# Patient Record
Sex: Female | Born: 1980 | Race: Black or African American | Hispanic: No | Marital: Single | Smoking: Never smoker
Health system: Southern US, Community
[De-identification: ages and names within clinical notes are randomized; demographics above are authoritative.]

## PROBLEM LIST (undated history)

## (undated) DIAGNOSIS — E119 Type 2 diabetes mellitus without complications: Secondary | ICD-10-CM

## (undated) DIAGNOSIS — Z789 Other specified health status: Secondary | ICD-10-CM

## (undated) HISTORY — DX: Other specified health status: Z78.9

## (undated) HISTORY — PX: NO PAST SURGERIES: SHX2092

---

## 2021-01-09 LAB — OB RESULTS CONSOLE RPR: RPR: NONREACTIVE

## 2021-01-09 LAB — OB RESULTS CONSOLE GC/CHLAMYDIA
Chlamydia: NEGATIVE
Gonorrhea: NEGATIVE

## 2021-01-09 LAB — OB RESULTS CONSOLE HIV ANTIBODY (ROUTINE TESTING): HIV: NONREACTIVE

## 2021-01-09 LAB — OB RESULTS CONSOLE ANTIBODY SCREEN: Antibody Screen: NEGATIVE

## 2021-01-09 LAB — OB RESULTS CONSOLE ABO/RH: RH Type: POSITIVE

## 2021-01-09 LAB — HEPATITIS C ANTIBODY: HCV Ab: NEGATIVE

## 2021-01-09 LAB — OB RESULTS CONSOLE VARICELLA ZOSTER ANTIBODY, IGG: Varicella: IMMUNE

## 2021-01-09 LAB — OB RESULTS CONSOLE RUBELLA ANTIBODY, IGM: Rubella: IMMUNE

## 2021-01-09 LAB — OB RESULTS CONSOLE HGB/HCT, BLOOD
HCT: 33 (ref 29–41)
Hemoglobin: 10.6

## 2021-01-09 LAB — OB RESULTS CONSOLE HEPATITIS B SURFACE ANTIGEN: Hepatitis B Surface Ag: NEGATIVE

## 2021-01-09 LAB — OB RESULTS CONSOLE PLATELET COUNT: Platelets: 247

## 2021-01-16 LAB — HEMOGLOBIN EVAL RFX ELECTROPHORESIS
Cystic Fibrosis Profile: NEGATIVE
Drug Screen, Urine: NEGATIVE
Glucose 1 Hour: 90
Hemoglobin Evaluation: NORMAL
Pap: NEGATIVE
Urine Culture, OB: NORMAL

## 2021-01-20 ENCOUNTER — Inpatient Hospital Stay (HOSPITAL_COMMUNITY)
Admission: AD | Admit: 2021-01-20 | Payer: Medicaid Other | Source: Home / Self Care | Admitting: Obstetrics & Gynecology

## 2021-01-28 ENCOUNTER — Other Ambulatory Visit: Payer: Self-pay | Admitting: Family Medicine

## 2021-01-28 DIAGNOSIS — Z363 Encounter for antenatal screening for malformations: Secondary | ICD-10-CM

## 2021-01-28 DIAGNOSIS — O09522 Supervision of elderly multigravida, second trimester: Secondary | ICD-10-CM

## 2021-02-03 ENCOUNTER — Other Ambulatory Visit: Payer: Self-pay

## 2021-02-11 ENCOUNTER — Encounter: Payer: Self-pay | Admitting: *Deleted

## 2021-02-12 ENCOUNTER — Ambulatory Visit: Payer: Self-pay

## 2021-02-14 ENCOUNTER — Ambulatory Visit: Payer: Medicaid Other | Admitting: *Deleted

## 2021-02-14 ENCOUNTER — Ambulatory Visit: Payer: Medicaid Other

## 2021-02-14 ENCOUNTER — Other Ambulatory Visit: Payer: Self-pay

## 2021-02-14 ENCOUNTER — Encounter: Payer: Self-pay | Admitting: *Deleted

## 2021-02-14 ENCOUNTER — Ambulatory Visit: Payer: Self-pay | Attending: Obstetrics | Admitting: Obstetrics

## 2021-02-14 ENCOUNTER — Ambulatory Visit: Payer: Medicaid Other | Attending: Family Medicine

## 2021-02-14 ENCOUNTER — Other Ambulatory Visit: Payer: Self-pay | Admitting: *Deleted

## 2021-02-14 VITALS — BP 105/56 | HR 88 | Ht 63.0 in

## 2021-02-14 DIAGNOSIS — O3513X0 Maternal care for (suspected) chromosomal abnormality in fetus, trisomy 21, not applicable or unspecified: Secondary | ICD-10-CM

## 2021-02-14 DIAGNOSIS — Z363 Encounter for antenatal screening for malformations: Secondary | ICD-10-CM | POA: Diagnosis present

## 2021-02-14 DIAGNOSIS — O09522 Supervision of elderly multigravida, second trimester: Secondary | ICD-10-CM

## 2021-02-14 DIAGNOSIS — O99212 Obesity complicating pregnancy, second trimester: Secondary | ICD-10-CM

## 2021-02-14 DIAGNOSIS — O280 Abnormal hematological finding on antenatal screening of mother: Secondary | ICD-10-CM

## 2021-02-14 DIAGNOSIS — Z3A23 23 weeks gestation of pregnancy: Secondary | ICD-10-CM

## 2021-02-14 NOTE — Progress Notes (Signed)
MFM Note  Referring provider: [facility] Department  Joyce Campbell was seen for a detailed fetal anatomy scan due to advanced maternal age (40 years old).  Her quad screen indicated an elevated Down syndrome risk of 1:51.  She denies any significant past medical history and denies any problems in her current pregnancy.    She was informed that the fetal growth and amniotic fluid level were appropriate for her gestational age.   The following were noted on today's ultrasound exam:  Mild bilateral ventriculomegaly in the fetal brain measuring 1.2 to 1.3 cm dilated.  The posterior fossa appeared within normal limits.  The third and fourth ventricles did not appear dilated.  The association of the ventriculomegaly with Down syndrome was discussed today.  Absent fetal nasal bone.  The association of an absent fetal nasal bone with Down syndrome was discussed today.  The patient was informed that anomalies may be missed due to technical limitations.   The patient was advised that the ventriculomegaly and the absent nasal bone noted on today's ultrasound exam further increases her Down syndrome risk.  She was advised that she may have a cell free DNA test drawn today to further refine her Down syndrome risk.  She declined to have the cell free DNA test drawn today.    She was also advised regarding the availability of an amniocentesis for definitive diagnosis of fetal chromosomal abnormalities.  She declined to have an amniocentesis today.  The patient stated that she will just wait until after the baby is born and have the baby tested at that time.  She declined genetic counseling today.  Due to her increased Down syndrome risk, we will continue to follow her with monthly growth ultrasounds.    She was referred for a fetal echocardiogram with [facility].  We will consider ordering a fetal MRI should the ventriculomegaly worsen later in her pregnancy.  The  patient stated that she understood everything that was discussed with her and that all of her questions have been answered today.    All conversations were held with the patient today with the help of a [place] interpreter.  A total of 45 minutes was spent counseling and coordinating the care for this patient.  Greater than 50% of the time was spent in direct face-to-face contact.

## 2021-02-14 NOTE — Progress Notes (Signed)
Joyce Campbell [facility] by Dr. Parke Poisson on 02/14/2021 for an anatomy ultrasound in light of her abnormal quad screen indicating increased risk for Down syndrome. [name] was offered genetic counseling today to discuss the abnormal quad screen result. [name] declined genetic counseling.  Teena Dunk, MS, [facility]

## 2021-03-17 ENCOUNTER — Ambulatory Visit: Payer: Medicaid Other | Admitting: *Deleted

## 2021-03-17 ENCOUNTER — Encounter: Payer: Self-pay | Admitting: *Deleted

## 2021-03-17 ENCOUNTER — Ambulatory Visit: Payer: Medicaid Other | Attending: Obstetrics

## 2021-03-17 ENCOUNTER — Other Ambulatory Visit: Payer: Self-pay

## 2021-03-17 VITALS — BP 114/64 | HR 96

## 2021-03-17 DIAGNOSIS — Z3A28 28 weeks gestation of pregnancy: Secondary | ICD-10-CM

## 2021-03-17 DIAGNOSIS — O358XX0 Maternal care for other (suspected) fetal abnormality and damage, not applicable or unspecified: Secondary | ICD-10-CM

## 2021-03-17 DIAGNOSIS — O3513X0 Maternal care for (suspected) chromosomal abnormality in fetus, trisomy 21, not applicable or unspecified: Secondary | ICD-10-CM | POA: Diagnosis present

## 2021-03-17 DIAGNOSIS — O285 Abnormal chromosomal and genetic finding on antenatal screening of mother: Secondary | ICD-10-CM | POA: Insufficient documentation

## 2021-03-17 DIAGNOSIS — O09523 Supervision of elderly multigravida, third trimester: Secondary | ICD-10-CM | POA: Insufficient documentation

## 2021-03-17 DIAGNOSIS — O09522 Supervision of elderly multigravida, second trimester: Secondary | ICD-10-CM | POA: Diagnosis present

## 2021-03-17 DIAGNOSIS — Z658 Other specified problems related to psychosocial circumstances: Secondary | ICD-10-CM

## 2021-03-17 DIAGNOSIS — O0990 Supervision of high risk pregnancy, unspecified, unspecified trimester: Secondary | ICD-10-CM | POA: Insufficient documentation

## 2021-03-17 DIAGNOSIS — O0930 Supervision of pregnancy with insufficient antenatal care, unspecified trimester: Secondary | ICD-10-CM | POA: Insufficient documentation

## 2021-03-17 HISTORY — DX: Abnormal chromosomal and genetic finding on antenatal screening of mother: O28.5

## 2021-03-21 ENCOUNTER — Encounter: Payer: Self-pay | Admitting: Obstetrics & Gynecology

## 2021-03-21 ENCOUNTER — Other Ambulatory Visit: Payer: Self-pay

## 2021-03-21 ENCOUNTER — Ambulatory Visit (INDEPENDENT_AMBULATORY_CARE_PROVIDER_SITE_OTHER): Payer: Medicaid Other | Admitting: Obstetrics & Gynecology

## 2021-03-21 VITALS — BP 121/73 | HR 103 | Wt 215.0 lb

## 2021-03-21 DIAGNOSIS — Z658 Other specified problems related to psychosocial circumstances: Secondary | ICD-10-CM

## 2021-03-21 DIAGNOSIS — O0930 Supervision of pregnancy with insufficient antenatal care, unspecified trimester: Secondary | ICD-10-CM | POA: Diagnosis not present

## 2021-03-21 DIAGNOSIS — Z23 Encounter for immunization: Secondary | ICD-10-CM | POA: Diagnosis not present

## 2021-03-21 DIAGNOSIS — O35BXX0 Maternal care for other (suspected) fetal abnormality and damage, fetal cardiac anomalies, not applicable or unspecified: Secondary | ICD-10-CM | POA: Insufficient documentation

## 2021-03-21 DIAGNOSIS — Q210 Ventricular septal defect: Secondary | ICD-10-CM | POA: Diagnosis not present

## 2021-03-21 DIAGNOSIS — O285 Abnormal chromosomal and genetic finding on antenatal screening of mother: Secondary | ICD-10-CM | POA: Diagnosis not present

## 2021-03-21 DIAGNOSIS — O0990 Supervision of high risk pregnancy, unspecified, unspecified trimester: Secondary | ICD-10-CM

## 2021-03-21 DIAGNOSIS — O09523 Supervision of elderly multigravida, third trimester: Secondary | ICD-10-CM | POA: Insufficient documentation

## 2021-03-21 HISTORY — DX: Maternal care for other (suspected) fetal abnormality and damage, fetal cardiac anomalies, not applicable or unspecified: O35.BXX0

## 2021-03-21 MED ORDER — PRENATAL PLUS 27-1 MG PO TABS: 1.0000 | ORAL_TABLET | Freq: Every day | ORAL | 11 refills | Status: AC

## 2021-03-21 NOTE — Progress Notes (Signed)
  Subjective:transfer due to DSR    Joyce Campbell is a [id number] [redacted]w[redacted]d being seen today for her first obstetrical visit.  Her obstetrical history is significant for  AMA and DSR elevated . Patient does intend to breast feed. Pregnancy history fully reviewed.  Patient reports nausea.  Vitals:   03/21/21 1007  BP: 121/73  Pulse: (!) 103  Weight: 215 lb (97.5 kg)    HISTORY: OB History  Gravida Para Term Preterm AB Living  4 3 3     3   SAB IAB Ectopic Multiple Live Births          3    # Outcome Date GA Lbr Len/2nd Weight Sex Delivery Anes PTL Lv  4 Current           3 Term 12/10/14 [redacted]w[redacted]d  8 lb (3.629 kg)  Vag-Spont     2 Term 08/31/09 [redacted]w[redacted]d  9 lb (4.082 kg)  Vag-Spont     1 Term 02/20/07 [redacted]w[redacted]d  8 lb (3.629 kg)  Vag-Spont      Past Medical History:  Diagnosis Date   Medical history non-contributory    Past Surgical History:  Procedure Laterality Date   NO PAST SURGERIES     Family History  Problem Relation Age of Onset   Hypertension Mother      Exam    Uterus:   46 CM FH  Pelvic Exam:    Perineum: Pelvic deferred   Vulva:    Vagina:     pH:    Cervix:    Adnexa:    Bony Pelvis:   System: Breast:  deferred   Skin: normal coloration and turgor, no rashes    Neurologic: normal, normal mood   Extremities: normal strength, tone, and muscle mass   HEENT PERRLA   Mouth/Teeth    Neck supple   Cardiovascular: regular rate and rhythm   Respiratory:  appears well, vitals normal, no respiratory distress, acyanotic, normal RR   Abdomen: Gravid and obese   Urinary:       Assessment:    Pregnancy: 34 Patient Active Problem List   Diagnosis Date Noted   Abnormal genetic test during pregnancy 03/17/2021   Late prenatal care 03/17/2021   Supervision of high risk pregnancy, antepartum 03/17/2021   Psychosocial stressors 03/17/2021    Fetal VSD DSR  elevated declined chromosomal testing   Plan:     Initial labs abstracted Prenatal vitamins and  ASA 81 mg Problem list reviewed and updated. Genetic Screening discussed : results reviewed.  Ultrasound discussed; fetal survey: results reviewed.  Follow up in 2 weeks. 50% of 30 min visit spent on counseling and coordination of care.  MFM f/u.   03/19/2021 03/21/2021

## 2021-04-07 ENCOUNTER — Ambulatory Visit (INDEPENDENT_AMBULATORY_CARE_PROVIDER_SITE_OTHER): Payer: Medicaid Other | Admitting: Obstetrics and Gynecology

## 2021-04-07 ENCOUNTER — Other Ambulatory Visit: Payer: Self-pay

## 2021-04-07 ENCOUNTER — Encounter: Payer: Self-pay | Admitting: Obstetrics and Gynecology

## 2021-04-07 VITALS — BP 114/66 | HR 101 | Wt 220.3 lb

## 2021-04-07 DIAGNOSIS — O09523 Supervision of elderly multigravida, third trimester: Secondary | ICD-10-CM | POA: Diagnosis not present

## 2021-04-07 DIAGNOSIS — O0990 Supervision of high risk pregnancy, unspecified, unspecified trimester: Secondary | ICD-10-CM | POA: Diagnosis not present

## 2021-04-07 DIAGNOSIS — O285 Abnormal chromosomal and genetic finding on antenatal screening of mother: Secondary | ICD-10-CM

## 2021-04-07 DIAGNOSIS — Z789 Other specified health status: Secondary | ICD-10-CM

## 2021-04-07 DIAGNOSIS — Q210 Ventricular septal defect: Secondary | ICD-10-CM

## 2021-04-07 MED ORDER — COMFORT FIT MATERNITY SUPP MED MISC: 1.0000 | Freq: Once | 0 refills | Status: AC

## 2021-04-07 MED ORDER — ASPIRIN 81 MG PO TBEC: 81.0000 mg | DELAYED_RELEASE_TABLET | Freq: Every day | ORAL | 2 refills | Status: DC

## 2021-04-07 NOTE — Progress Notes (Signed)
° °  PRENATAL VISIT NOTE  Subjective:  Joyce Campbell is a 40 y.o. G4P3003 at 109w0d being seen today for ongoing prenatal care.  She is currently monitored for the following issues for this high-risk pregnancy and has Fetus with chance for Trisomy 38; Late prenatal care; Supervision of high risk pregnancy, antepartum; Psychosocial stressors; AMA (advanced maternal age) multigravida 35+, third trimester; VSD (ventricular septal defect), perimembranous; and Language barrier on their problem list.  Patient reports  occasional pelvic pressure .  Contractions: Not present. Vag. Bleeding: None.  Movement: Present. Denies leaking of fluid.   The following portions of the patient's history were reviewed and updated as appropriate: allergies, current medications, past family history, past medical history, past social history, past surgical history and problem list.   Objective:   Vitals:   04/07/21 1316  BP: 114/66  Pulse: (!) 101  Weight: 220 lb 4.8 oz (99.9 kg)    Fetal Status: Fetal Heart Rate (bpm): 142   Movement: Present     General:  Alert, oriented and cooperative. Patient is in no acute distress.  Skin: Skin is warm and dry. No rash noted.   Cardiovascular: Normal heart rate noted  Respiratory: Normal respiratory effort, no problems with respiration noted  Abdomen: Soft, gravid, appropriate for gestational age.  Pain/Pressure: Absent     Pelvic: Cervical exam deferred        Extremities: Normal range of motion.  Edema: None  Mental Status: Normal mood and affect. Normal behavior. Normal judgment and thought content.   Assessment and Plan:  Pregnancy: G4P3003 at [redacted]w[redacted]d 1. Supervision of high risk pregnancy, antepartum Pregnancy belt sent in. Asa refill sent in Pt had an early 1h GTT which was negative but has not had a 24-28wk one. Pt unable to do today. Will do next time she is here along with hiv, rpr, cbc - aspirin 81 MG EC tablet; Take 1 tablet (81 mg total) by mouth daily.  Swallow whole.  Dispense: 30 tablet; Refill: 2  2. Language barrier Interpreter used  3. AMA (advanced maternal age) multigravida 35+, third trimester See below  4. VSD (ventricular septal defect), perimembranous Has rpt fetal echo on 1/12. Should be fine for vag delivery and delivery at [facility] per last report but f/u 1/12 report  5. Fetus with chance for Trisomy 21 F/u 12/28 rpt growth. 11/28 87%, 1387gm, ac >99%, afi 14.3. fetus also with slight b/l ventriculomegaly (11-76mm) and absent nasal bone  Preterm labor symptoms and general obstetric precautions including but not limited to vaginal bleeding, contractions, leaking of fluid and fetal movement were reviewed in detail with the patient. Please refer to After Visit Summary for other counseling recommendations.   Return in 9 days (on 04/16/2021) for 1h GTT test.  Future Appointments  Date Time Provider Department Center  04/16/2021  9:15 AM [facility] NURSE [facility]  04/16/2021  9:30 AM [facility] US3 [facility]  04/23/2021  9:30 AM [facility] NURSE [facility]  04/23/2021  9:45 AM [facility] US6 [facility]    [place] Bing, MD

## 2021-04-09 ENCOUNTER — Other Ambulatory Visit: Payer: Self-pay | Admitting: *Deleted

## 2021-04-09 DIAGNOSIS — O09523 Supervision of elderly multigravida, third trimester: Secondary | ICD-10-CM

## 2021-04-09 DIAGNOSIS — O35BXX1 Maternal care for other (suspected) fetal abnormality and damage, fetal cardiac anomalies, fetus 1: Secondary | ICD-10-CM

## 2021-04-09 DIAGNOSIS — O280 Abnormal hematological finding on antenatal screening of mother: Secondary | ICD-10-CM

## 2021-04-15 ENCOUNTER — Other Ambulatory Visit: Payer: Self-pay | Admitting: *Deleted

## 2021-04-15 DIAGNOSIS — O35BXX0 Maternal care for other (suspected) fetal abnormality and damage, fetal cardiac anomalies, not applicable or unspecified: Secondary | ICD-10-CM

## 2021-04-15 DIAGNOSIS — O280 Abnormal hematological finding on antenatal screening of mother: Secondary | ICD-10-CM

## 2021-04-15 DIAGNOSIS — O09523 Supervision of elderly multigravida, third trimester: Secondary | ICD-10-CM

## 2021-04-16 ENCOUNTER — Ambulatory Visit: Payer: Medicaid Other | Admitting: *Deleted

## 2021-04-16 ENCOUNTER — Other Ambulatory Visit: Payer: Self-pay | Admitting: *Deleted

## 2021-04-16 ENCOUNTER — Other Ambulatory Visit: Payer: Self-pay

## 2021-04-16 ENCOUNTER — Other Ambulatory Visit: Payer: Self-pay | Admitting: General Practice

## 2021-04-16 ENCOUNTER — Other Ambulatory Visit: Payer: Self-pay | Admitting: Obstetrics

## 2021-04-16 ENCOUNTER — Ambulatory Visit: Payer: Medicaid Other | Attending: Obstetrics

## 2021-04-16 ENCOUNTER — Other Ambulatory Visit: Payer: Medicaid Other

## 2021-04-16 VITALS — BP 106/61 | HR 104

## 2021-04-16 DIAGNOSIS — O283 Abnormal ultrasonic finding on antenatal screening of mother: Secondary | ICD-10-CM

## 2021-04-16 DIAGNOSIS — O35BXX0 Maternal care for other (suspected) fetal abnormality and damage, fetal cardiac anomalies, not applicable or unspecified: Secondary | ICD-10-CM | POA: Diagnosis present

## 2021-04-16 DIAGNOSIS — O0990 Supervision of high risk pregnancy, unspecified, unspecified trimester: Secondary | ICD-10-CM

## 2021-04-16 DIAGNOSIS — O3663X1 Maternal care for excessive fetal growth, third trimester, fetus 1: Secondary | ICD-10-CM

## 2021-04-16 DIAGNOSIS — O285 Abnormal chromosomal and genetic finding on antenatal screening of mother: Secondary | ICD-10-CM

## 2021-04-16 DIAGNOSIS — O0930 Supervision of pregnancy with insufficient antenatal care, unspecified trimester: Secondary | ICD-10-CM | POA: Insufficient documentation

## 2021-04-16 DIAGNOSIS — O280 Abnormal hematological finding on antenatal screening of mother: Secondary | ICD-10-CM | POA: Diagnosis present

## 2021-04-16 DIAGNOSIS — O09523 Supervision of elderly multigravida, third trimester: Secondary | ICD-10-CM | POA: Diagnosis present

## 2021-04-16 DIAGNOSIS — Z658 Other specified problems related to psychosocial circumstances: Secondary | ICD-10-CM

## 2021-04-16 NOTE — Procedures (Signed)
Joyce Campbell [birth year] [redacted]w[redacted]d  Fetus A Non-Stress Test Interpretation for 04/16/21  Indication: Unsatisfactory BPP  Fetal Heart Rate A Mode: External Baseline Rate (A): 140 bpm Variability: Moderate Accelerations: 15 x 15 Decelerations: None Multiple birth?: No  Uterine Activity Mode: Palpation, Toco Contraction Frequency (min): none Resting Tone Palpated: Relaxed  Interpretation (Fetal Testing) Nonstress Test Interpretation: Reactive Overall Impression: Reassuring for gestational age Comments: Dr. Parke Poisson reviewed tracing

## 2021-04-23 ENCOUNTER — Ambulatory Visit: Payer: Medicaid Other | Admitting: *Deleted

## 2021-04-23 ENCOUNTER — Other Ambulatory Visit: Payer: Self-pay | Admitting: *Deleted

## 2021-04-23 ENCOUNTER — Encounter: Payer: Self-pay | Admitting: *Deleted

## 2021-04-23 ENCOUNTER — Ambulatory Visit: Payer: Medicaid Other | Attending: Obstetrics

## 2021-04-23 ENCOUNTER — Other Ambulatory Visit: Payer: Self-pay

## 2021-04-23 VITALS — BP 98/50 | HR 99

## 2021-04-23 DIAGNOSIS — Z658 Other specified problems related to psychosocial circumstances: Secondary | ICD-10-CM | POA: Diagnosis present

## 2021-04-23 DIAGNOSIS — O09523 Supervision of elderly multigravida, third trimester: Secondary | ICD-10-CM | POA: Diagnosis present

## 2021-04-23 DIAGNOSIS — O0990 Supervision of high risk pregnancy, unspecified, unspecified trimester: Secondary | ICD-10-CM | POA: Insufficient documentation

## 2021-04-23 DIAGNOSIS — O280 Abnormal hematological finding on antenatal screening of mother: Secondary | ICD-10-CM | POA: Diagnosis present

## 2021-04-23 DIAGNOSIS — O35BXX0 Maternal care for other (suspected) fetal abnormality and damage, fetal cardiac anomalies, not applicable or unspecified: Secondary | ICD-10-CM | POA: Diagnosis present

## 2021-04-23 DIAGNOSIS — O285 Abnormal chromosomal and genetic finding on antenatal screening of mother: Secondary | ICD-10-CM | POA: Diagnosis present

## 2021-04-23 DIAGNOSIS — O0930 Supervision of pregnancy with insufficient antenatal care, unspecified trimester: Secondary | ICD-10-CM

## 2021-04-23 DIAGNOSIS — O3509X0 Maternal care for (suspected) other central nervous system malformation or damage in fetus, not applicable or unspecified: Secondary | ICD-10-CM

## 2021-04-23 DIAGNOSIS — Q758 Other specified congenital malformations of skull and face bones: Secondary | ICD-10-CM

## 2021-04-23 DIAGNOSIS — O3513X0 Maternal care for (suspected) chromosomal abnormality in fetus, trisomy 21, not applicable or unspecified: Secondary | ICD-10-CM

## 2021-04-25 ENCOUNTER — Other Ambulatory Visit: Payer: Medicaid Other

## 2021-04-25 ENCOUNTER — Encounter: Payer: Self-pay | Admitting: Obstetrics and Gynecology

## 2021-04-25 ENCOUNTER — Ambulatory Visit (INDEPENDENT_AMBULATORY_CARE_PROVIDER_SITE_OTHER): Payer: Medicaid Other | Admitting: Obstetrics and Gynecology

## 2021-04-25 ENCOUNTER — Other Ambulatory Visit: Payer: Self-pay

## 2021-04-25 VITALS — BP 115/79 | HR 98 | Wt 220.0 lb

## 2021-04-25 DIAGNOSIS — Z789 Other specified health status: Secondary | ICD-10-CM

## 2021-04-25 DIAGNOSIS — O0990 Supervision of high risk pregnancy, unspecified, unspecified trimester: Secondary | ICD-10-CM

## 2021-04-25 DIAGNOSIS — O09523 Supervision of elderly multigravida, third trimester: Secondary | ICD-10-CM | POA: Diagnosis not present

## 2021-04-25 DIAGNOSIS — O3509X0 Maternal care for (suspected) other central nervous system malformation or damage in fetus, not applicable or unspecified: Secondary | ICD-10-CM

## 2021-04-25 DIAGNOSIS — O403XX0 Polyhydramnios, third trimester, not applicable or unspecified: Secondary | ICD-10-CM | POA: Insufficient documentation

## 2021-04-25 DIAGNOSIS — O3660X0 Maternal care for excessive fetal growth, unspecified trimester, not applicable or unspecified: Secondary | ICD-10-CM | POA: Insufficient documentation

## 2021-04-25 DIAGNOSIS — O3663X0 Maternal care for excessive fetal growth, third trimester, not applicable or unspecified: Secondary | ICD-10-CM

## 2021-04-25 DIAGNOSIS — O285 Abnormal chromosomal and genetic finding on antenatal screening of mother: Secondary | ICD-10-CM

## 2021-04-25 DIAGNOSIS — O0930 Supervision of pregnancy with insufficient antenatal care, unspecified trimester: Secondary | ICD-10-CM

## 2021-04-25 DIAGNOSIS — Q210 Ventricular septal defect: Secondary | ICD-10-CM

## 2021-04-25 DIAGNOSIS — O3513X0 Maternal care for (suspected) chromosomal abnormality in fetus, trisomy 21, not applicable or unspecified: Secondary | ICD-10-CM

## 2021-04-25 DIAGNOSIS — Q758 Other specified congenital malformations of skull and face bones: Secondary | ICD-10-CM

## 2021-04-25 HISTORY — DX: Maternal care for (suspected) other central nervous system malformation or damage in fetus, not applicable or unspecified: O35.09X0

## 2021-04-25 NOTE — Progress Notes (Signed)
PRENATAL VISIT NOTE  Subjective:  Joyce Campbell is a 41 y.o. G4P3003 at [redacted]w[redacted]d being seen today for ongoing prenatal care.  She is currently monitored for the following issues for this high-risk pregnancy and has Fetus with chance for Trisomy 30; Late prenatal care; Supervision of high risk pregnancy, antepartum; Psychosocial stressors; AMA (advanced maternal age) multigravida 35+, third trimester; VSD (ventricular septal defect), perimembranous; Language barrier; Pregnancy complicated by fetal cerebral ventriculomegaly; LGA (large for gestational age) fetus affecting management of mother; and Polyhydramnios in third trimester on their problem list.  Patient reports no complaints.  Contractions: Not present. Vag. Bleeding: None.  Movement: Present. Denies leaking of fluid.   The following portions of the patient's history were reviewed and updated as appropriate: allergies, current medications, past family history, past medical history, past social history, past surgical history and problem list.   Objective:   Vitals:   04/25/21 0935  BP: 115/79  Pulse: 98  Weight: 220 lb (99.8 kg)    Fetal Status: Fetal Heart Rate (bpm): 140   Movement: Present     General:  Alert, oriented and cooperative. Patient is in no acute distress.  Skin: Skin is warm and dry. No rash noted.   Cardiovascular: Normal heart rate noted  Respiratory: Normal respiratory effort, no problems with respiration noted  Abdomen: Soft, gravid, appropriate for gestational age.  Pain/Pressure: Present     Pelvic: Cervical exam deferred        Extremities: Normal range of motion.  Edema: None  Mental Status: Normal mood and affect. Normal behavior. Normal judgment and thought content.   Assessment and Plan:  Pregnancy: G4P3003 at [redacted]w[redacted]d 1. Supervision of high risk pregnancy, antepartum D/w her re: BC options and pt unsure. She does not want a BTL. 30 day papers requirement  2. Polyhydramnios in third trimester  complication, single or unspecified fetus F/u GTT for today 1/4: 25.62, bpp 8/8  3. Excessive fetal growth affecting management of pregnancy in third trimester, single or unspecified fetus F/u GTT for today 12/28: efw 99%, 2594g, ac >99%, afi 21.6  4. AMA (advanced maternal age) multigravida 62+, third trimester See below  5. VSD (ventricular septal defect), perimembranous Has rpt fetal echo on 1/12. F/u this and repeat growth u/s on 1/23 for delivery date finalization  6. Fetus with chance for Trisomy 21 1:51 on quad, pt declined genetic counseling and cffdna. See above  7. Pregnancy complicated by fetal cerebral ventriculomegaly, single or unspecified fetus Followed by mfm. 46mm on right and 9mm on left. 12/28 bpd 89%, hc 36%  8. Language barrier Interpreter used  9. Late prenatal care  Preterm labor symptoms and general obstetric precautions including but not limited to vaginal bleeding, contractions, leaking of fluid and fetal movement were reviewed in detail with the patient. Please refer to After Visit Summary for other counseling recommendations.   Return in about 12 days (around 05/07/2021) for in person, md visit, high risk ob.  Future Appointments  Date Time Provider [place]  04/29/2021  7:15 AM [facility] NURSE [facility]  04/29/2021  7:30 AM [facility] US2 [facility]  05/07/2021  7:30 AM [facility] NURSE [facility]  05/07/2021  7:45 AM [facility] US4 [facility]  05/12/2021  9:00 AM [facility] NURSE [facility]  05/12/2021  9:15 AM [facility] US2 [facility]  05/12/2021 10:55 AM Anyanwu, Sallyanne Havers, MD [facility]  05/21/2021  9:30 AM [facility] NURSE [facility]  05/21/2021  9:45 AM [facility] US6 [facility]  Aletha Halim, MD

## 2021-04-26 LAB — CBC
Hematocrit: 31.3 % — ABNORMAL LOW (ref 34.0–46.6)
Hemoglobin: 10.6 g/dL — ABNORMAL LOW (ref 11.1–15.9)
MCH: 28.8 pg (ref 26.6–33.0)
MCHC: 33.9 g/dL (ref 31.5–35.7)
MCV: 85 fL (ref 79–97)
Platelets: 207 10*3/uL (ref 150–450)
RBC: 3.68 x10E6/uL — ABNORMAL LOW (ref 3.77–5.28)
RDW: 12.9 % (ref 11.7–15.4)
WBC: 5.5 10*3/uL (ref 3.4–10.8)

## 2021-04-26 LAB — GLUCOSE TOLERANCE, 1 HOUR: Glucose, 1Hr PP: 139 mg/dL (ref 70–199)

## 2021-04-26 LAB — HIV ANTIBODY (ROUTINE TESTING W REFLEX): HIV Screen 4th Generation wRfx: NONREACTIVE

## 2021-04-26 LAB — RPR: RPR Ser Ql: NONREACTIVE

## 2021-04-27 ENCOUNTER — Encounter: Payer: Self-pay | Admitting: Obstetrics and Gynecology

## 2021-04-27 DIAGNOSIS — O24419 Gestational diabetes mellitus in pregnancy, unspecified control: Secondary | ICD-10-CM | POA: Insufficient documentation

## 2021-04-27 DIAGNOSIS — O99810 Abnormal glucose complicating pregnancy: Secondary | ICD-10-CM | POA: Insufficient documentation

## 2021-04-28 ENCOUNTER — Telehealth: Payer: Self-pay

## 2021-04-28 NOTE — Telephone Encounter (Addendum)
-----   Message from [place] Bing, MD sent at 04/27/2021  7:46 AM EST ----- Please let her know that she did not pass her 1 hour GTT and that she needs a 3 hour GTT  Called pt with [employer] Interpreter # [phone] and left message to return call about results and an appointment.    Leonette Nutting  04/28/21

## 2021-04-29 ENCOUNTER — Encounter: Payer: Self-pay | Admitting: *Deleted

## 2021-04-29 ENCOUNTER — Ambulatory Visit: Payer: Medicaid Other | Attending: Obstetrics

## 2021-04-29 ENCOUNTER — Other Ambulatory Visit: Payer: Self-pay

## 2021-04-29 ENCOUNTER — Ambulatory Visit: Payer: Medicaid Other | Admitting: *Deleted

## 2021-04-29 VITALS — BP 115/61 | HR 87

## 2021-04-29 DIAGNOSIS — Z3A34 34 weeks gestation of pregnancy: Secondary | ICD-10-CM | POA: Diagnosis not present

## 2021-04-29 DIAGNOSIS — O285 Abnormal chromosomal and genetic finding on antenatal screening of mother: Secondary | ICD-10-CM

## 2021-04-29 DIAGNOSIS — O0930 Supervision of pregnancy with insufficient antenatal care, unspecified trimester: Secondary | ICD-10-CM | POA: Insufficient documentation

## 2021-04-29 DIAGNOSIS — O3663X0 Maternal care for excessive fetal growth, third trimester, not applicable or unspecified: Secondary | ICD-10-CM

## 2021-04-29 DIAGNOSIS — O0990 Supervision of high risk pregnancy, unspecified, unspecified trimester: Secondary | ICD-10-CM | POA: Diagnosis present

## 2021-04-29 DIAGNOSIS — Z658 Other specified problems related to psychosocial circumstances: Secondary | ICD-10-CM

## 2021-04-29 DIAGNOSIS — O3663X1 Maternal care for excessive fetal growth, third trimester, fetus 1: Secondary | ICD-10-CM | POA: Insufficient documentation

## 2021-04-29 DIAGNOSIS — O09523 Supervision of elderly multigravida, third trimester: Secondary | ICD-10-CM

## 2021-04-30 ENCOUNTER — Other Ambulatory Visit: Payer: Self-pay

## 2021-04-30 DIAGNOSIS — R7309 Other abnormal glucose: Secondary | ICD-10-CM

## 2021-04-30 NOTE — Telephone Encounter (Signed)
Called pt with [facility] Interpreter # [phone] and informed pt that failed her 1 hr and we need to come in for a 3 hr testing.  Pt stated that she would be able to come in on 05/02/21 at 0815.  Pt placed on lab schedule for 1000 however pt will come in at 0815.  Addison Naegeli, RN  04/30/21

## 2021-05-01 ENCOUNTER — Encounter: Payer: Self-pay | Admitting: Obstetrics

## 2021-05-02 ENCOUNTER — Other Ambulatory Visit: Payer: Self-pay

## 2021-05-02 ENCOUNTER — Other Ambulatory Visit: Payer: Medicaid Other

## 2021-05-02 DIAGNOSIS — O0990 Supervision of high risk pregnancy, unspecified, unspecified trimester: Secondary | ICD-10-CM

## 2021-05-02 NOTE — Addendum Note (Signed)
Addended by: Isabell Jarvis on: 05/02/2021 08:32 AM   Modules accepted: Orders

## 2021-05-02 NOTE — Addendum Note (Signed)
Addended by: Isabell Jarvis on: 05/02/2021 08:29 AM   Modules accepted: Orders

## 2021-05-02 NOTE — Addendum Note (Signed)
Addended by: Isabell Jarvis on: 05/02/2021 08:13 AM   Modules accepted: Orders

## 2021-05-03 ENCOUNTER — Encounter: Payer: Self-pay | Admitting: Radiology

## 2021-05-03 LAB — GESTATIONAL GLUCOSE TOLERANCE
Glucose, Fasting: 81 mg/dL (ref 70–94)
Glucose, GTT - 1 Hour: 157 mg/dL (ref 70–179)
Glucose, GTT - 2 Hour: 186 mg/dL — ABNORMAL HIGH (ref 70–154)
Glucose, GTT - 3 Hour: 169 mg/dL — ABNORMAL HIGH (ref 70–139)

## 2021-05-05 ENCOUNTER — Telehealth: Payer: Self-pay

## 2021-05-05 ENCOUNTER — Encounter: Payer: Self-pay | Admitting: Obstetrics and Gynecology

## 2021-05-05 DIAGNOSIS — O24419 Gestational diabetes mellitus in pregnancy, unspecified control: Secondary | ICD-10-CM

## 2021-05-05 MED ORDER — ACCU-CHEK SOFTCLIX LANCETS MISC: 12 refills | Status: DC

## 2021-05-05 MED ORDER — ACCU-CHEK GUIDE VI STRP: ORAL_STRIP | 12 refills | Status: DC

## 2021-05-05 MED ORDER — ACCU-CHEK GUIDE W/DEVICE KIT: 1.0000 | PACK | Freq: Once | 0 refills | Status: DC

## 2021-05-05 NOTE — Telephone Encounter (Addendum)
-----   Message from [place] Bing, MD sent at 05/05/2021  8:06 AM EST ----- Please let her know that she has gdm and set her up with education, supplies, etc.    Called pt with [employer] interpreter ID [phone]; VM left stating I am calling with results from glucose test. Requested pt return call to office. Callback number given. Referral and supplied ordered. Will attempt to contact patient a second time.

## 2021-05-06 NOTE — Telephone Encounter (Signed)
Called pt with [place] interpreter ID [phone] Joyce Campbell; VM left stating I am calling with results and callback number given.

## 2021-05-07 ENCOUNTER — Ambulatory Visit: Payer: Medicaid Other | Attending: Obstetrics

## 2021-05-07 ENCOUNTER — Encounter: Payer: Self-pay | Admitting: *Deleted

## 2021-05-07 ENCOUNTER — Ambulatory Visit: Payer: Medicaid Other | Admitting: *Deleted

## 2021-05-07 ENCOUNTER — Other Ambulatory Visit: Payer: Self-pay | Admitting: Obstetrics

## 2021-05-07 ENCOUNTER — Other Ambulatory Visit: Payer: Self-pay

## 2021-05-07 VITALS — BP 110/66 | HR 90

## 2021-05-07 DIAGNOSIS — Z658 Other specified problems related to psychosocial circumstances: Secondary | ICD-10-CM

## 2021-05-07 DIAGNOSIS — O35EXX0 Maternal care for other (suspected) fetal abnormality and damage, fetal genitourinary anomalies, not applicable or unspecified: Secondary | ICD-10-CM

## 2021-05-07 DIAGNOSIS — O0990 Supervision of high risk pregnancy, unspecified, unspecified trimester: Secondary | ICD-10-CM

## 2021-05-07 DIAGNOSIS — O35BXX0 Maternal care for other (suspected) fetal abnormality and damage, fetal cardiac anomalies, not applicable or unspecified: Secondary | ICD-10-CM

## 2021-05-07 DIAGNOSIS — O3663X1 Maternal care for excessive fetal growth, third trimester, fetus 1: Secondary | ICD-10-CM | POA: Diagnosis present

## 2021-05-07 DIAGNOSIS — O285 Abnormal chromosomal and genetic finding on antenatal screening of mother: Secondary | ICD-10-CM | POA: Diagnosis present

## 2021-05-07 DIAGNOSIS — O3509X0 Maternal care for (suspected) other central nervous system malformation or damage in fetus, not applicable or unspecified: Secondary | ICD-10-CM | POA: Diagnosis present

## 2021-05-07 DIAGNOSIS — O09523 Supervision of elderly multigravida, third trimester: Secondary | ICD-10-CM

## 2021-05-07 DIAGNOSIS — O0930 Supervision of pregnancy with insufficient antenatal care, unspecified trimester: Secondary | ICD-10-CM

## 2021-05-07 DIAGNOSIS — Z3A35 35 weeks gestation of pregnancy: Secondary | ICD-10-CM | POA: Diagnosis not present

## 2021-05-07 MED ORDER — ACCU-CHEK GUIDE W/DEVICE KIT: 1.0000 | PACK | Freq: Once | 0 refills | Status: AC

## 2021-05-07 NOTE — Procedures (Signed)
Kelsea Crites [birth year] [redacted]w[redacted]d  Fetus A Non-Stress Test Interpretation for 05/07/21  Indication: GDM-diet, AMA, Poly, Failed BPP, ventriculomegaly, VSD  Fetal Heart Rate A Mode: External Baseline Rate (A): 135 bpm Variability: Moderate Accelerations: 15 x 15 Decelerations: None Multiple birth?: No  Uterine Activity Mode: Palpation, Toco Contraction Frequency (min): Occas Contraction Quality: Mild Resting Tone Palpated: Relaxed Resting Time: Adequate  Interpretation (Fetal Testing) Nonstress Test Interpretation: Reactive Comments: Dr. Parke Poisson reviewed tracing.

## 2021-05-07 NOTE — Telephone Encounter (Addendum)
Notice pt had an appt with MFM this am and requested pt to come down after U/S appt.  With interpreter Donnie [place], pt notified about GDM dx and that we have sent in diabetes testing supplies to [facility] on [address] and [employer].  If she could please pick the test strips, lancets, and meter.  Pt informed that Diabetes Education appt scheduled 05/13/21 at 1400.  Pt provided with the information to the appt and requested to bring in the testing supplies to the appt so that she can be taught on how to check her blood sugars.  Pt verbalized understanding with no further questions.   Mel Almond, RN  05/07/21

## 2021-05-07 NOTE — Addendum Note (Signed)
Addended by: Michel Harrow on: 05/07/2021 09:47 AM   Modules accepted: Orders

## 2021-05-12 ENCOUNTER — Ambulatory Visit: Payer: Medicaid Other | Admitting: *Deleted

## 2021-05-12 ENCOUNTER — Encounter: Payer: Medicaid Other | Admitting: Obstetrics & Gynecology

## 2021-05-12 ENCOUNTER — Ambulatory Visit (HOSPITAL_BASED_OUTPATIENT_CLINIC_OR_DEPARTMENT_OTHER): Payer: Medicaid Other

## 2021-05-12 ENCOUNTER — Other Ambulatory Visit: Payer: Self-pay

## 2021-05-12 ENCOUNTER — Encounter: Payer: Self-pay | Admitting: *Deleted

## 2021-05-12 ENCOUNTER — Encounter (HOSPITAL_COMMUNITY): Payer: Self-pay | Admitting: Family Medicine

## 2021-05-12 ENCOUNTER — Ambulatory Visit (HOSPITAL_BASED_OUTPATIENT_CLINIC_OR_DEPARTMENT_OTHER): Payer: Medicaid Other | Admitting: Obstetrics

## 2021-05-12 ENCOUNTER — Inpatient Hospital Stay
Admission: AD | Admit: 2021-05-12 | Discharge: 2021-05-15 | DRG: 788 | Disposition: A | Discharge status: Home / Self Care | Payer: Medicaid Other | Attending: Obstetrics and Gynecology | Admitting: Obstetrics and Gynecology

## 2021-05-12 VITALS — BP 111/64 | HR 102

## 2021-05-12 DIAGNOSIS — O24420 Gestational diabetes mellitus in childbirth, diet controlled: Secondary | ICD-10-CM | POA: Diagnosis present

## 2021-05-12 DIAGNOSIS — O99214 Obesity complicating childbirth: Secondary | ICD-10-CM | POA: Diagnosis present

## 2021-05-12 DIAGNOSIS — O368190 Decreased fetal movements, unspecified trimester, not applicable or unspecified: Principal | ICD-10-CM | POA: Diagnosis present

## 2021-05-12 DIAGNOSIS — O0930 Supervision of pregnancy with insufficient antenatal care, unspecified trimester: Secondary | ICD-10-CM

## 2021-05-12 DIAGNOSIS — O3663X0 Maternal care for excessive fetal growth, third trimester, not applicable or unspecified: Secondary | ICD-10-CM | POA: Diagnosis not present

## 2021-05-12 DIAGNOSIS — O359XX0 Maternal care for (suspected) fetal abnormality and damage, unspecified, not applicable or unspecified: Secondary | ICD-10-CM

## 2021-05-12 DIAGNOSIS — Z20822 Contact with and (suspected) exposure to covid-19: Secondary | ICD-10-CM | POA: Diagnosis present

## 2021-05-12 DIAGNOSIS — Z3A36 36 weeks gestation of pregnancy: Secondary | ICD-10-CM

## 2021-05-12 DIAGNOSIS — Z7982 Long term (current) use of aspirin: Secondary | ICD-10-CM

## 2021-05-12 DIAGNOSIS — O3663X1 Maternal care for excessive fetal growth, third trimester, fetus 1: Secondary | ICD-10-CM

## 2021-05-12 DIAGNOSIS — O09293 Supervision of pregnancy with other poor reproductive or obstetric history, third trimester: Secondary | ICD-10-CM | POA: Diagnosis not present

## 2021-05-12 DIAGNOSIS — Z658 Other specified problems related to psychosocial circumstances: Secondary | ICD-10-CM

## 2021-05-12 DIAGNOSIS — O280 Abnormal hematological finding on antenatal screening of mother: Secondary | ICD-10-CM | POA: Diagnosis not present

## 2021-05-12 DIAGNOSIS — O0990 Supervision of high risk pregnancy, unspecified, unspecified trimester: Secondary | ICD-10-CM

## 2021-05-12 DIAGNOSIS — O3509X0 Maternal care for (suspected) other central nervous system malformation or damage in fetus, not applicable or unspecified: Secondary | ICD-10-CM | POA: Diagnosis present

## 2021-05-12 DIAGNOSIS — O24419 Gestational diabetes mellitus in pregnancy, unspecified control: Secondary | ICD-10-CM

## 2021-05-12 DIAGNOSIS — O09523 Supervision of elderly multigravida, third trimester: Secondary | ICD-10-CM

## 2021-05-12 DIAGNOSIS — O403XX0 Polyhydramnios, third trimester, not applicable or unspecified: Secondary | ICD-10-CM | POA: Diagnosis present

## 2021-05-12 DIAGNOSIS — O35BXX0 Maternal care for other (suspected) fetal abnormality and damage, fetal cardiac anomalies, not applicable or unspecified: Secondary | ICD-10-CM | POA: Diagnosis present

## 2021-05-12 DIAGNOSIS — O368130 Decreased fetal movements, third trimester, not applicable or unspecified: Secondary | ICD-10-CM | POA: Diagnosis present

## 2021-05-12 DIAGNOSIS — O3660X0 Maternal care for excessive fetal growth, unspecified trimester, not applicable or unspecified: Secondary | ICD-10-CM | POA: Diagnosis present

## 2021-05-12 DIAGNOSIS — O285 Abnormal chromosomal and genetic finding on antenatal screening of mother: Secondary | ICD-10-CM

## 2021-05-12 DIAGNOSIS — O368330 Maternal care for abnormalities of the fetal heart rate or rhythm, third trimester, not applicable or unspecified: Secondary | ICD-10-CM | POA: Diagnosis not present

## 2021-05-12 HISTORY — DX: Gestational diabetes mellitus in pregnancy, unspecified control: O24.419

## 2021-05-12 HISTORY — DX: Type 2 diabetes mellitus without complications: E11.9

## 2021-05-12 LAB — GLUCOSE, CAPILLARY
Glucose-Capillary: 104 mg/dL — ABNORMAL HIGH (ref 70–99)
Glucose-Capillary: 68 mg/dL — ABNORMAL LOW (ref 70–99)

## 2021-05-12 LAB — CBC
HCT: 34.3 % — ABNORMAL LOW (ref 36.0–46.0)
Hemoglobin: 11.3 g/dL — ABNORMAL LOW (ref 12.0–15.0)
MCH: 29.2 pg (ref 26.0–34.0)
MCHC: 32.9 g/dL (ref 30.0–36.0)
MCV: 88.6 fL (ref 80.0–100.0)
Platelets: 207 10*3/uL (ref 150–400)
RBC: 3.87 MIL/uL (ref 3.87–5.11)
RDW: 14 % (ref 11.5–15.5)
WBC: 6.3 10*3/uL (ref 4.0–10.5)
nRBC: 0 % (ref 0.0–0.2)

## 2021-05-12 LAB — TYPE AND SCREEN
ABO/RH(D): O POS
Antibody Screen: NEGATIVE

## 2021-05-12 LAB — RESP PANEL BY RT-PCR (FLU A&B, COVID) ARPGX2
Influenza A by PCR: NEGATIVE
Influenza B by PCR: NEGATIVE
SARS Coronavirus 2 by RT PCR: NEGATIVE

## 2021-05-12 MED ORDER — OXYCODONE-ACETAMINOPHEN 5-325 MG PO TABS: 2.0000 | ORAL_TABLET | ORAL | Status: DC | PRN

## 2021-05-12 MED ORDER — PENICILLIN G POT IN DEXTROSE 60000 UNIT/ML IV SOLN
3.0000 10*6.[IU] | INTRAVENOUS | Status: DC
  Administered 2021-05-12 – 2021-05-13 (×3): 3 10*6.[IU] via INTRAVENOUS
  Filled 2021-05-12 (×3): qty 50

## 2021-05-12 MED ORDER — OXYTOCIN-SODIUM CHLORIDE 30-0.9 UT/500ML-% IV SOLN
2.5000 [IU]/h | INTRAVENOUS | Status: DC
  Filled 2021-05-12: qty 500

## 2021-05-12 MED ORDER — DIPHENHYDRAMINE HCL 50 MG/ML IJ SOLN
12.5000 mg | INTRAMUSCULAR | Status: DC | PRN
  Administered 2021-05-13: 04:00:00 12.5 mg via INTRAVENOUS
  Filled 2021-05-12: qty 1

## 2021-05-12 MED ORDER — FENTANYL-BUPIVACAINE-NACL 0.5-0.125-0.9 MG/250ML-% EP SOLN
12.0000 mL/h | EPIDURAL | Status: DC | PRN
  Administered 2021-05-13: 05:00:00 12 mL/h via EPIDURAL
  Filled 2021-05-12 (×2): qty 250

## 2021-05-12 MED ORDER — EPHEDRINE 5 MG/ML INJ: 10.0000 mg | INTRAVENOUS | Status: DC | PRN

## 2021-05-12 MED ORDER — ONDANSETRON HCL 4 MG/2ML IJ SOLN: 4.0000 mg | Freq: Four times a day (QID) | INTRAMUSCULAR | Status: DC | PRN

## 2021-05-12 MED ORDER — OXYTOCIN-SODIUM CHLORIDE 30-0.9 UT/500ML-% IV SOLN
1.0000 m[IU]/min | INTRAVENOUS | Status: DC
  Administered 2021-05-12: 2 m[IU]/min via INTRAVENOUS

## 2021-05-12 MED ORDER — OXYCODONE-ACETAMINOPHEN 5-325 MG PO TABS: 1.0000 | ORAL_TABLET | ORAL | Status: DC | PRN

## 2021-05-12 MED ORDER — PHENYLEPHRINE 40 MCG/ML (10ML) SYRINGE FOR IV PUSH (FOR BLOOD PRESSURE SUPPORT): 80.0000 ug | PREFILLED_SYRINGE | INTRAVENOUS | Status: DC | PRN

## 2021-05-12 MED ORDER — LACTATED RINGERS IV SOLN: 500.0000 mL | INTRAVENOUS | Status: DC | PRN

## 2021-05-12 MED ORDER — TERBUTALINE SULFATE 1 MG/ML IJ SOLN: 0.2500 mg | Freq: Once | INTRAMUSCULAR | Status: DC | PRN

## 2021-05-12 MED ORDER — LACTATED RINGERS IV SOLN
500.0000 mL | Freq: Once | INTRAVENOUS | Status: AC
  Administered 2021-05-13: 04:00:00 500 mL via INTRAVENOUS

## 2021-05-12 MED ORDER — LIDOCAINE HCL (PF) 1 % IJ SOLN: 30.0000 mL | INTRAMUSCULAR | Status: DC | PRN

## 2021-05-12 MED ORDER — LACTATED RINGERS IV SOLN: INTRAVENOUS | Status: DC

## 2021-05-12 MED ORDER — ACETAMINOPHEN 325 MG PO TABS: 650.0000 mg | ORAL_TABLET | ORAL | Status: DC | PRN

## 2021-05-12 MED ORDER — MISOPROSTOL 50MCG HALF TABLET
50.0000 ug | ORAL_TABLET | ORAL | Status: DC | PRN
  Administered 2021-05-12: 50 ug via ORAL
  Filled 2021-05-12: qty 1

## 2021-05-12 MED ORDER — SODIUM CHLORIDE 0.9 % IV SOLN
5.0000 10*6.[IU] | Freq: Once | INTRAVENOUS | Status: AC
  Administered 2021-05-12: 5 10*6.[IU] via INTRAVENOUS
  Filled 2021-05-12: qty 5

## 2021-05-12 MED ORDER — OXYTOCIN BOLUS FROM INFUSION: 333.0000 mL | Freq: Once | INTRAVENOUS | Status: DC

## 2021-05-12 MED ORDER — SOD CITRATE-CITRIC ACID 500-334 MG/5ML PO SOLN
30.0000 mL | ORAL | Status: DC | PRN
  Filled 2021-05-12: qty 30

## 2021-05-12 NOTE — H&P (Addendum)
OBSTETRIC ADMISSION HISTORY AND PHYSICAL  Joyce Campbell is a 41 y.o. female [phone] with IUP at [redacted]w[redacted]d by LMP presenting for IOL d/t nonreassuring fetal testing.   Pt was seen by MFM today and had BPP of 4/10 d/t absent fetal tone, absent fetal movement and nonreactive NST. She reports No LOF, no VB, no blurry vision, headaches or RUQ pain.  She does report decreased fetal movement today. She plans on breast and bottle feeding. She is unsure about birth control. She received her prenatal care at [facility]   Dating: By LMP --->  Estimated Date of Delivery: 06/09/21  Sono:    @[redacted]w[redacted]d , LGA, normal anatomy, cephalic presentation, posterior fundal placenta, 3974g, >99% EFW  Prenatal History/Complications:  Late to prenatal care at [redacted]w[redacted]d A1GDM Fetal cerebral ventriculomegaly  Fetus with chance for Trisomy 21: Quad screen with 1:51 trisomy 21 risk LGA fetus Fetal echo with VSD AMA, ASA 81mg  Polyhydramnios in third trimester Psychosocial stressors, FOB lives in [redacted]w[redacted]d Language barrier, speaks french  Past Medical History: Past Medical History:  Diagnosis Date   Medical history non-contributory     Past Surgical History: Past Surgical History:  Procedure Laterality Date   NO PAST SURGERIES      Obstetrical History: OB History     Gravida  4   Para  3   Term  3   Preterm      AB      Living  3      SAB      IAB      Ectopic      Multiple      Live Births  3           Social History Social History   Socioeconomic History   Marital status: Single    Spouse name: Not on file   Number of children: Not on file   Years of education: Not on file   Highest education level: Not on file  Occupational History   Not on file  Tobacco Use   Smoking status: Never   Smokeless tobacco: Never  Vaping Use   Vaping Use: Never used  Substance and Sexual Activity   Alcohol use: Never   Drug use: Never   Sexual activity: Yes    Birth control/protection: None   Other Topics Concern   Not on file  Social History Narrative   Not on file   Social Determinants of Health   Financial Resource Strain: Not on file  Food Insecurity: No Food Insecurity   Worried About Running Out of Food in the Last Year: Never true   Ran Out of Food in the Last Year: Never true  Transportation Needs: No Transportation Needs   Lack of Transportation (Medical): No   Lack of Transportation (Non-Medical): No  Physical Activity: Not on file  Stress: Not on file  Social Connections: Not on file    Family History: Family History  Problem Relation Age of Onset   Hypertension Mother     Allergies: No Known Allergies  Medications Prior to Admission  Medication Sig Dispense Refill Last Dose   Accu-Chek Softclix Lancets lancets Use as instructed; check blood glucose 4 times daily 100 each 12    aspirin 81 MG EC tablet Take 1 tablet (81 mg total) by mouth daily. Swallow whole. 30 tablet 2    glucose blood (ACCU-CHEK GUIDE) test strip Use as instructed; check blood glucose 4 times daily 100 each 12    prenatal vitamin w/FE, FA (PRENATAL  1 + 1) 27-1 MG TABS tablet Take 1 tablet by mouth daily at 12 noon. (Patient not taking: Reported on 05/07/2021) 30 tablet 11      Review of Systems   All systems reviewed and negative except as stated in HPI  Last menstrual period 09/02/2020. General appearance: alert, cooperative, no distress, and morbidly obese Lungs: normal WOB Heart: well perfused Abdomen: soft, non-tender; bowel sounds normal Presentation: cephalic Fetal monitoringBaseline: 150 bpm, Variability: Good {> 6 bpm), Accelerations: Reactive, and Decelerations: Absent Uterine activity irregular    Prenatal labs: ABO, Rh: O/Positive/-- (09/22 0000) Antibody: Negative (09/22 0000) Rubella: Immune (09/22 0000) RPR: Non Reactive (01/06 0934)  HBsAg: Negative (09/22 0000)  HIV: Non Reactive (01/06 0934)  GBS:   unknown 3 hr Glucola abnormal Genetic screening   quad screen 1:51 trisomy 21 risk Anatomy US showed mild bilateral ventriculomegaly, absent nasal bone  Prenatal Transfer Tool  Maternal Diabetes: Yes:  Diabetes Type:  Diet controlled Genetic Screening: Abnormal:  Results: Elevated risk of Trisomy 21 Maternal Ultrasounds/Referrals: Absent nasal bone and Other:bilateal ventriculomegaly, VSD Fetal Ultrasounds or other Referrals:  Fetal echo, Referred to Materal Fetal Medicine  Maternal Substance Abuse:  No Significant Maternal Medications:  None Significant Maternal Lab Results: None  No results found for this or any previous visit (from the past 24 hour(s)).  Patient Active Problem List   Diagnosis Date Noted   GDM (gestational diabetes mellitus) 04/27/2021   Pregnancy complicated by fetal cerebral ventriculomegaly 04/25/2021   LGA (large for gestational age) fetus affecting management of mother 04/25/2021   Polyhydramnios in third trimester 04/25/2021   Language barrier 04/07/2021   AMA (advanced maternal age) multigravida 35+, third trimester 03/21/2021   Fetal VSD (ventricular septal defect), perimembranous 03/21/2021   Fetus with chance for Trisomy 21 03/17/2021   Late prenatal care 03/17/2021   Supervision of high risk pregnancy, antepartum 03/17/2021   Psychosocial stressors 03/17/2021    Assessment/Plan:  Joyce Campbell is a 41 y.o. G4P3003 at [redacted]w[redacted]d here for IOL d/t non reassuring fetal testing.  #Labor:cervix is thick, closed and posterior. Will start with cervical ripening with Cytotec.  #Pain: PRN #FWB: Cat 1 #ID: GBS unknown. Gestational age < 37 weeks, PCN ordered #MOF: breast and bottle #MOC:unsure #A1GDM: CBGs q4h  Erick Alley, DO  05/12/2021, 2:45 PM  Midwife attestation: I have seen and examined this patient; I agree with above documentation in the resident's note.   PE: Gen: calm comfortable, NAD Resp: normal effort and rate Abd: gravid  ROS, labs, PMH reviewed  Assessment/Plan: [redacted] weeks  gestation Labor: latent FWB: Cat I GBS: unknown Admit to LD Cervical ripening CBGs q4 Anticipate SVD  Donette Larry, CNM  05/12/2021, 5:30 PM

## 2021-05-12 NOTE — Procedures (Signed)
Joyce Campbell 1980-07-20 [redacted]w[redacted]d  Fetus A Non-Stress Test Interpretation for 05/12/21  Indication: Unsatisfactory BPP and AMA, T21, multiple fetal anomalies, GDM  Fetal Heart Rate A Mode: External Baseline Rate (A): 145 bpm Variability: Moderate Accelerations: 10 x 10 Decelerations: None Multiple birth?: No  Uterine Activity Mode: Palpation, Toco Contraction Frequency (min): Irreg Contraction Duration (sec): 80-100 Contraction Quality: Mild Resting Tone Palpated: Relaxed Resting Time: Adequate  Interpretation (Fetal Testing) Nonstress Test Interpretation: Non-reactive Comments: Dr. Parke Poisson reviewed tracing. Dr. Parke Poisson spoke with Dr. Crissie Reese and the plan is for patient to be admitted for delivery today.

## 2021-05-12 NOTE — Progress Notes (Signed)
MFM Note  Joyce Campbell was seen for a follow up growth scan due to diet-controlled gestational diabetes.  The fetus is presumed to have Down syndrome due to the elevated Down syndrome risk noted on her quad screen in addition to the VSD, absent nasal bone, and bilateral ventriculomegaly that has been noted in the fetal brain.  She has declined an amniocentesis for definitive diagnosis of Down syndrome.  She reports 3 prior uncomplicated vaginal births.  She was informed that the fetal growth measures large for her gestational age (EFW 8 pounds 12 ounces, greater than 99th percentile).  There was normal amniotic fluid noted. The fetus is in the vertex presentation.   A biophysical profile performed today was 4 out of 10.  She received  -2's for absent fetal tone, absent fetal movements, and a nonreactive NST.  Due to nonreassuring fetal testing and the increased risk of an IUFD due to her pregnancy complications, delivery is recommended at her current gestational age.  The patient was sent to the hospital for induction immediately following today's ultrasound exam.    The patient is agreeable for delivery today.  All conversations were held with the patient today with the help of a [place] interpreter.  A total of 20 minutes was spent counseling and coordinating the care for this patient.  Greater than 50% of the time was spent in direct face-to-face contact.

## 2021-05-12 NOTE — Progress Notes (Signed)
Joyce Campbell is a 41 y.o. [id number] at [redacted]w[redacted]d by LMP admitted for induction of labor due to non-reassuring fetal testing (BPP 4/10 with absent fetal tone, absent fetal movement and nonreactive NST).  Subjective: In to introduce myself to patient as part of night team. She reports that she is doing well, breathing through CTXs which are occurring every 3-10 minutes. Declines any pain management at this time, desires natural labor. Discussed placement of FB with patient who is understanding and in agreement.   Objective: BP 132/82    Pulse (!) 111    Temp 98.2 F (36.8 C) (Oral)    Resp 17    Ht 5' 2.6" (1.59 m)    Wt 102.1 kg    LMP 09/02/2020    BMI 40.37 kg/m  No intake/output data recorded. No intake/output data recorded.  FHT:  FHR: 135 bpm, variability: minimal ,  accelerations:  Present,  decelerations:  Absent UC:   regular, every 4-10 minutes SVE:   Dilation: 2.5 Effacement (%): Thick Station: [place], -3 Exam by:: Dr. Jimmye Norman  Labs: Lab Results  Component Value Date   WBC 6.3 05/12/2021   HGB 11.3 (L) 05/12/2021   HCT 34.3 (L) 05/12/2021   MCV 88.6 05/12/2021   PLT 207 05/12/2021    Assessment / Plan: Induction of labor due to non-reassuring fetal testing, s/p Cytotec   Labor:  S/p Cytotec with cervical change. Discussed FB placement and initiation of low-dose pitocin with patient, who is understanding and in agreement. FB now in place and low-dose pitocin started.  Fetal Wellbeing:  Category II Pain Control:  Labor support without medications I/D:   Penicillin for GBS ppx Anticipated MOD:  NSVD  Joyce Backer, MD 05/12/2021, 10:12 PM

## 2021-05-13 ENCOUNTER — Encounter (HOSPITAL_COMMUNITY): Payer: Self-pay | Admitting: Family Medicine

## 2021-05-13 ENCOUNTER — Encounter: Payer: Medicaid Other | Admitting: Nutrition

## 2021-05-13 ENCOUNTER — Other Ambulatory Visit: Payer: Self-pay

## 2021-05-13 ENCOUNTER — Inpatient Hospital Stay (HOSPITAL_COMMUNITY): Payer: Medicaid Other | Admitting: Anesthesiology

## 2021-05-13 ENCOUNTER — Encounter (HOSPITAL_COMMUNITY)
Admission: AD | Disposition: A | Discharge status: Home / Self Care | Payer: Self-pay | Source: Home / Self Care | Attending: Family Medicine

## 2021-05-13 DIAGNOSIS — Z3A36 36 weeks gestation of pregnancy: Secondary | ICD-10-CM

## 2021-05-13 DIAGNOSIS — O403XX0 Polyhydramnios, third trimester, not applicable or unspecified: Secondary | ICD-10-CM

## 2021-05-13 DIAGNOSIS — O368330 Maternal care for abnormalities of the fetal heart rate or rhythm, third trimester, not applicable or unspecified: Secondary | ICD-10-CM

## 2021-05-13 DIAGNOSIS — O3663X0 Maternal care for excessive fetal growth, third trimester, not applicable or unspecified: Secondary | ICD-10-CM

## 2021-05-13 DIAGNOSIS — O24420 Gestational diabetes mellitus in childbirth, diet controlled: Secondary | ICD-10-CM

## 2021-05-13 LAB — GLUCOSE, CAPILLARY
Glucose-Capillary: 115 mg/dL — ABNORMAL HIGH (ref 70–99)
Glucose-Capillary: 131 mg/dL — ABNORMAL HIGH (ref 70–99)
Glucose-Capillary: 92 mg/dL (ref 70–99)

## 2021-05-13 LAB — RPR: RPR Ser Ql: NONREACTIVE

## 2021-05-13 SURGERY — Surgical Case
Anesthesia: Epidural

## 2021-05-13 MED ORDER — IBUPROFEN 600 MG PO TABS
600.0000 mg | ORAL_TABLET | Freq: Four times a day (QID) | ORAL | Status: DC
  Administered 2021-05-14 – 2021-05-15 (×5): 600 mg via ORAL
  Filled 2021-05-13 (×5): qty 1

## 2021-05-13 MED ORDER — TRANEXAMIC ACID-NACL 1000-0.7 MG/100ML-% IV SOLN
INTRAVENOUS | Status: AC
  Filled 2021-05-13: qty 100

## 2021-05-13 MED ORDER — OXYTOCIN-SODIUM CHLORIDE 30-0.9 UT/500ML-% IV SOLN
INTRAVENOUS | Status: DC | PRN
  Administered 2021-05-13: 300 mL via INTRAVENOUS

## 2021-05-13 MED ORDER — OXYCODONE HCL 5 MG PO TABS: 5.0000 mg | ORAL_TABLET | ORAL | Status: DC | PRN

## 2021-05-13 MED ORDER — ACETAMINOPHEN 500 MG PO TABS
1000.0000 mg | ORAL_TABLET | Freq: Four times a day (QID) | ORAL | Status: DC
  Administered 2021-05-13 – 2021-05-15 (×6): 1000 mg via ORAL
  Filled 2021-05-13 (×6): qty 2

## 2021-05-13 MED ORDER — PROMETHAZINE HCL 25 MG/ML IJ SOLN: 6.2500 mg | INTRAMUSCULAR | Status: DC | PRN

## 2021-05-13 MED ORDER — NALOXONE HCL 0.4 MG/ML IJ SOLN: 0.4000 mg | INTRAMUSCULAR | Status: DC | PRN

## 2021-05-13 MED ORDER — MORPHINE SULFATE (PF) 0.5 MG/ML IJ SOLN
INTRAMUSCULAR | Status: AC
  Filled 2021-05-13: qty 10

## 2021-05-13 MED ORDER — SIMETHICONE 80 MG PO CHEW: 80.0000 mg | CHEWABLE_TABLET | ORAL | Status: DC | PRN

## 2021-05-13 MED ORDER — MAGNESIUM HYDROXIDE 400 MG/5ML PO SUSP: 30.0000 mL | ORAL | Status: DC | PRN

## 2021-05-13 MED ORDER — LIDOCAINE-EPINEPHRINE (PF) 2 %-1:200000 IJ SOLN
INTRAMUSCULAR | Status: AC
  Filled 2021-05-13: qty 20

## 2021-05-13 MED ORDER — FENTANYL CITRATE (PF) 100 MCG/2ML IJ SOLN
INTRAMUSCULAR | Status: AC
  Filled 2021-05-13: qty 2

## 2021-05-13 MED ORDER — KETOROLAC TROMETHAMINE 30 MG/ML IJ SOLN: 30.0000 mg | Freq: Four times a day (QID) | INTRAMUSCULAR | Status: DC | PRN

## 2021-05-13 MED ORDER — DEXAMETHASONE SODIUM PHOSPHATE 4 MG/ML IJ SOLN
INTRAMUSCULAR | Status: AC
  Filled 2021-05-13: qty 1

## 2021-05-13 MED ORDER — CEFAZOLIN SODIUM-DEXTROSE 2-4 GM/100ML-% IV SOLN
2.0000 g | INTRAVENOUS | Status: AC
  Administered 2021-05-13: 08:00:00 2 g via INTRAVENOUS

## 2021-05-13 MED ORDER — DIPHENHYDRAMINE HCL 25 MG PO CAPS: 25.0000 mg | ORAL_CAPSULE | Freq: Four times a day (QID) | ORAL | Status: DC | PRN

## 2021-05-13 MED ORDER — ONDANSETRON HCL 4 MG/2ML IJ SOLN
4.0000 mg | Freq: Three times a day (TID) | INTRAMUSCULAR | Status: DC | PRN
  Administered 2021-05-13: 12:00:00 4 mg via INTRAVENOUS
  Filled 2021-05-13: qty 2

## 2021-05-13 MED ORDER — PHENYLEPHRINE 40 MCG/ML (10ML) SYRINGE FOR IV PUSH (FOR BLOOD PRESSURE SUPPORT)
PREFILLED_SYRINGE | INTRAVENOUS | Status: AC
  Filled 2021-05-13: qty 10

## 2021-05-13 MED ORDER — TRANEXAMIC ACID-NACL 1000-0.7 MG/100ML-% IV SOLN
INTRAVENOUS | Status: DC | PRN
  Administered 2021-05-13: 1000 mg via INTRAVENOUS

## 2021-05-13 MED ORDER — SODIUM CHLORIDE 0.9 % IV SOLN
500.0000 mg | INTRAVENOUS | Status: AC
  Administered 2021-05-13: 08:00:00 500 mg via INTRAVENOUS

## 2021-05-13 MED ORDER — ACETAMINOPHEN 10 MG/ML IV SOLN
INTRAVENOUS | Status: AC
  Filled 2021-05-13: qty 100

## 2021-05-13 MED ORDER — STERILE WATER FOR IRRIGATION IR SOLN
Status: DC | PRN
  Administered 2021-05-13: 1000 mL

## 2021-05-13 MED ORDER — ONDANSETRON HCL 4 MG/2ML IJ SOLN
INTRAMUSCULAR | Status: AC
  Filled 2021-05-13: qty 4

## 2021-05-13 MED ORDER — SIMETHICONE 80 MG PO CHEW
80.0000 mg | CHEWABLE_TABLET | Freq: Three times a day (TID) | ORAL | Status: DC
  Administered 2021-05-13 – 2021-05-15 (×5): 80 mg via ORAL
  Filled 2021-05-13 (×5): qty 1

## 2021-05-13 MED ORDER — ENOXAPARIN SODIUM 60 MG/0.6ML IJ SOSY
50.0000 mg | PREFILLED_SYRINGE | INTRAMUSCULAR | Status: DC
  Administered 2021-05-13 – 2021-05-14 (×2): 50 mg via SUBCUTANEOUS
  Filled 2021-05-13 (×2): qty 0.6

## 2021-05-13 MED ORDER — ACETAMINOPHEN 500 MG PO TABS: 1000.0000 mg | ORAL_TABLET | Freq: Four times a day (QID) | ORAL | Status: DC

## 2021-05-13 MED ORDER — ACETAMINOPHEN 10 MG/ML IV SOLN
INTRAVENOUS | Status: DC | PRN
  Administered 2021-05-13: 1000 mg via INTRAVENOUS

## 2021-05-13 MED ORDER — MEASLES, MUMPS & RUBELLA VAC IJ SOLR: 0.5000 mL | Freq: Once | INTRAMUSCULAR | Status: DC

## 2021-05-13 MED ORDER — LIDOCAINE-EPINEPHRINE (PF) 2 %-1:200000 IJ SOLN
INTRAMUSCULAR | Status: DC | PRN
  Administered 2021-05-13: 10 mL via EPIDURAL

## 2021-05-13 MED ORDER — KETOROLAC TROMETHAMINE 30 MG/ML IJ SOLN
30.0000 mg | Freq: Four times a day (QID) | INTRAMUSCULAR | Status: AC
  Administered 2021-05-13 – 2021-05-14 (×4): 30 mg via INTRAVENOUS
  Filled 2021-05-13 (×4): qty 1

## 2021-05-13 MED ORDER — MEDROXYPROGESTERONE ACETATE 150 MG/ML IM SUSP: 150.0000 mg | INTRAMUSCULAR | Status: DC | PRN

## 2021-05-13 MED ORDER — OXYCODONE HCL 5 MG PO TABS: 5.0000 mg | ORAL_TABLET | Freq: Once | ORAL | Status: DC | PRN

## 2021-05-13 MED ORDER — DIBUCAINE (PERIANAL) 1 % EX OINT: 1.0000 "application " | TOPICAL_OINTMENT | CUTANEOUS | Status: DC | PRN

## 2021-05-13 MED ORDER — FENTANYL CITRATE (PF) 100 MCG/2ML IJ SOLN
INTRAMUSCULAR | Status: DC | PRN
  Administered 2021-05-13: 100 ug via EPIDURAL

## 2021-05-13 MED ORDER — MENTHOL 3 MG MT LOZG: 1.0000 | LOZENGE | OROMUCOSAL | Status: DC | PRN

## 2021-05-13 MED ORDER — SCOPOLAMINE 1 MG/3DAYS TD PT72
1.0000 | MEDICATED_PATCH | Freq: Once | TRANSDERMAL | Status: DC
  Administered 2021-05-13: 12:00:00 1.5 mg via TRANSDERMAL
  Filled 2021-05-13: qty 1

## 2021-05-13 MED ORDER — DIPHENHYDRAMINE HCL 25 MG PO CAPS
25.0000 mg | ORAL_CAPSULE | ORAL | Status: DC | PRN
  Filled 2021-05-13: qty 1

## 2021-05-13 MED ORDER — ONDANSETRON HCL 4 MG/2ML IJ SOLN
INTRAMUSCULAR | Status: DC | PRN
Start: 2021-05-13 — End: 2021-05-13
  Administered 2021-05-13: 4 mg via INTRAVENOUS

## 2021-05-13 MED ORDER — MEPERIDINE HCL 25 MG/ML IJ SOLN: 6.2500 mg | INTRAMUSCULAR | Status: DC | PRN

## 2021-05-13 MED ORDER — COCONUT OIL OIL
1.0000 "application " | TOPICAL_OIL | Status: DC | PRN
  Administered 2021-05-13: 1 via TOPICAL

## 2021-05-13 MED ORDER — OXYCODONE HCL 5 MG/5ML PO SOLN: 5.0000 mg | Freq: Once | ORAL | Status: DC | PRN

## 2021-05-13 MED ORDER — LACTATED RINGERS IV SOLN
INTRAVENOUS | Status: DC
  Administered 2021-05-14: 07:00:00 125 mL/h via INTRAVENOUS

## 2021-05-13 MED ORDER — SENNOSIDES-DOCUSATE SODIUM 8.6-50 MG PO TABS
2.0000 | ORAL_TABLET | Freq: Every day | ORAL | Status: DC
  Administered 2021-05-14 – 2021-05-15 (×2): 2 via ORAL
  Filled 2021-05-13 (×2): qty 2

## 2021-05-13 MED ORDER — SODIUM CHLORIDE 0.9 % IR SOLN
Status: DC | PRN
  Administered 2021-05-13: 1

## 2021-05-13 MED ORDER — WITCH HAZEL-GLYCERIN EX PADS: 1.0000 "application " | MEDICATED_PAD | CUTANEOUS | Status: DC | PRN

## 2021-05-13 MED ORDER — PHENYLEPHRINE 40 MCG/ML (10ML) SYRINGE FOR IV PUSH (FOR BLOOD PRESSURE SUPPORT)
PREFILLED_SYRINGE | INTRAVENOUS | Status: AC
  Filled 2021-05-13: qty 20

## 2021-05-13 MED ORDER — SODIUM CHLORIDE 0.9% FLUSH: 3.0000 mL | INTRAVENOUS | Status: DC | PRN

## 2021-05-13 MED ORDER — GABAPENTIN 100 MG PO CAPS
100.0000 mg | ORAL_CAPSULE | Freq: Every day | ORAL | Status: DC
  Administered 2021-05-13 – 2021-05-14 (×2): 100 mg via ORAL
  Filled 2021-05-13 (×2): qty 1

## 2021-05-13 MED ORDER — OXYTOCIN-SODIUM CHLORIDE 30-0.9 UT/500ML-% IV SOLN
2.5000 [IU]/h | INTRAVENOUS | Status: AC
  Administered 2021-05-13: 12:00:00 2.5 [IU]/h via INTRAVENOUS

## 2021-05-13 MED ORDER — PHENYLEPHRINE HCL (PRESSORS) 10 MG/ML IV SOLN
INTRAVENOUS | Status: DC | PRN
  Administered 2021-05-13 (×6): 80 ug via INTRAVENOUS
  Administered 2021-05-13: 120 ug via INTRAVENOUS

## 2021-05-13 MED ORDER — OXYTOCIN-SODIUM CHLORIDE 30-0.9 UT/500ML-% IV SOLN
INTRAVENOUS | Status: AC
  Filled 2021-05-13: qty 500

## 2021-05-13 MED ORDER — DIPHENHYDRAMINE HCL 50 MG/ML IJ SOLN: 12.5000 mg | INTRAMUSCULAR | Status: DC | PRN

## 2021-05-13 MED ORDER — TETANUS-DIPHTH-ACELL PERTUSSIS 5-2.5-18.5 LF-MCG/0.5 IM SUSY: 0.5000 mL | PREFILLED_SYRINGE | Freq: Once | INTRAMUSCULAR | Status: DC

## 2021-05-13 MED ORDER — MORPHINE SULFATE (PF) 0.5 MG/ML IJ SOLN
INTRAMUSCULAR | Status: DC | PRN
  Administered 2021-05-13: 3 mg via EPIDURAL

## 2021-05-13 MED ORDER — SOD CITRATE-CITRIC ACID 500-334 MG/5ML PO SOLN
30.0000 mL | ORAL | Status: AC
  Administered 2021-05-13: 08:00:00 30 mL via ORAL

## 2021-05-13 MED ORDER — HYDROMORPHONE HCL 1 MG/ML IJ SOLN: 0.2500 mg | INTRAMUSCULAR | Status: DC | PRN

## 2021-05-13 MED ORDER — NALOXONE HCL 4 MG/10ML IJ SOLN
1.0000 ug/kg/h | INTRAVENOUS | Status: DC | PRN
  Filled 2021-05-13: qty 5

## 2021-05-13 MED ORDER — LIDOCAINE HCL (PF) 1 % IJ SOLN
INTRAMUSCULAR | Status: DC | PRN
  Administered 2021-05-13: 6 mL via EPIDURAL
  Administered 2021-05-13: 4 mL via EPIDURAL

## 2021-05-13 MED ORDER — KETOROLAC TROMETHAMINE 30 MG/ML IJ SOLN: 30.0000 mg | Freq: Once | INTRAMUSCULAR | Status: DC | PRN

## 2021-05-13 MED ORDER — PRENATAL MULTIVITAMIN CH
1.0000 | ORAL_TABLET | Freq: Every day | ORAL | Status: DC
  Administered 2021-05-13 – 2021-05-15 (×3): 1 via ORAL
  Filled 2021-05-13 (×3): qty 1

## 2021-05-13 SURGICAL SUPPLY — 34 items
BENZOIN TINCTURE PRP APPL 2/3 (GAUZE/BANDAGES/DRESSINGS) ×2 IMPLANT
CHLORAPREP W/TINT 26ML (MISCELLANEOUS) ×2 IMPLANT
CLAMP CORD UMBIL (MISCELLANEOUS) IMPLANT
CLOTH BEACON ORANGE TIMEOUT ST (SAFETY) ×2 IMPLANT
DERMABOND ADVANCED (GAUZE/BANDAGES/DRESSINGS) ×1
DERMABOND ADVANCED .7 DNX12 (GAUZE/BANDAGES/DRESSINGS) IMPLANT
DRSG OPSITE POSTOP 4X10 (GAUZE/BANDAGES/DRESSINGS) ×2 IMPLANT
ELECT REM PT RETURN 9FT ADLT (ELECTROSURGICAL) ×2
ELECTRODE REM PT RTRN 9FT ADLT (ELECTROSURGICAL) ×1 IMPLANT
EXTRACTOR VACUUM M CUP 4 TUBE (SUCTIONS) IMPLANT
GAUZE SPONGE 4X4 12PLY STRL LF (GAUZE/BANDAGES/DRESSINGS) ×2 IMPLANT
GLOVE BIOGEL PI IND STRL 7.0 (GLOVE) ×2 IMPLANT
GLOVE BIOGEL PI IND STRL 7.5 (GLOVE) ×2 IMPLANT
GLOVE BIOGEL PI INDICATOR 7.0 (GLOVE) ×2
GLOVE BIOGEL PI INDICATOR 7.5 (GLOVE) ×2
GLOVE ECLIPSE 7.5 STRL STRAW (GLOVE) ×2 IMPLANT
GOWN STRL REUS W/TWL LRG LVL3 (GOWN DISPOSABLE) ×6 IMPLANT
KIT ABG SYR 3ML LUER SLIP (SYRINGE) IMPLANT
NDL HYPO 25X5/8 SAFETYGLIDE (NEEDLE) IMPLANT
NEEDLE HYPO 25X5/8 SAFETYGLIDE (NEEDLE) IMPLANT
NS IRRIG 1000ML POUR BTL (IV SOLUTION) ×2 IMPLANT
PACK C SECTION WH (CUSTOM PROCEDURE TRAY) ×2 IMPLANT
PAD ABD 7.5X8 STRL (GAUZE/BANDAGES/DRESSINGS) ×1 IMPLANT
PAD OB MATERNITY 4.3X12.25 (PERSONAL CARE ITEMS) ×2 IMPLANT
RTRCTR C-SECT PINK 25CM LRG (MISCELLANEOUS) ×2 IMPLANT
STRIP CLOSURE SKIN 1/2X4 (GAUZE/BANDAGES/DRESSINGS) ×2 IMPLANT
SUT VIC AB 0 CTX 36 (SUTURE) ×3
SUT VIC AB 0 CTX36XBRD ANBCTRL (SUTURE) ×3 IMPLANT
SUT VIC AB 2-0 CT1 27 (SUTURE) ×1
SUT VIC AB 2-0 CT1 TAPERPNT 27 (SUTURE) ×1 IMPLANT
SUT VIC AB 4-0 KS 27 (SUTURE) ×2 IMPLANT
TOWEL OR 17X24 6PK STRL BLUE (TOWEL DISPOSABLE) ×2 IMPLANT
TRAY FOLEY W/BAG SLVR 14FR LF (SET/KITS/TRAYS/PACK) ×2 IMPLANT
WATER STERILE IRR 1000ML POUR (IV SOLUTION) ×2 IMPLANT

## 2021-05-13 NOTE — Progress Notes (Signed)
Went into the room to try and get pt up out of bed for the first time. Neonatologist had just finished speaking with the patient regarding her baby's status. Pt states she would like to try getting up later as she needs some time to absorb this information the neonatologist had just given her. Will abide by patient's wishes and try ambulating patient later tonight.

## 2021-05-13 NOTE — Op Note (Signed)
Joyce Campbell PROCEDURE DATE: 05/12/2021 - 05/13/2021  PREOPERATIVE DIAGNOSIS: Intrauterine pregnancy at  [redacted]w[redacted]d weeks gestation; non-reassuring fetal status  POSTOPERATIVE DIAGNOSIS: The same  PROCEDURE: Primary Low Transverse Cesarean Section  SURGEON:  Dr. Candelaria Celeste  ASSISTANT: Dr. Leticia Penna   INDICATIONS: Joyce Campbell is a 41 y.o. [id number] at [redacted]w[redacted]d scheduled for cesarean section secondary to non-reassuring fetal status.  The risks of cesarean section discussed with the patient included but were not limited to: bleeding which may require transfusion or reoperation; infection which may require antibiotics; injury to bowel, bladder, ureters or other surrounding organs; injury to the fetus; need for additional procedures including hysterectomy in the event of a life-threatening hemorrhage; placental abnormalities wth subsequent pregnancies, incisional problems, thromboembolic phenomenon and other postoperative/anesthesia complications. The patient concurred with the proposed plan, giving informed written consent for the procedure.    FINDINGS:  Viable female infant in cephalic presentation.  Apgars 5 and 7, weight 4200g.  Clear amniotic fluid.  Intact placenta, three vessel cord.  Normal uterus, fallopian tubes and ovaries bilaterally.  ANESTHESIA:    Spinal INTRAVENOUS FLUIDS: 1500 ml ESTIMATED BLOOD LOSS: 689 ml URINE OUTPUT:  100 ml SPECIMENS: Placenta sent to pathology COMPLICATIONS: None immediate  PROCEDURE IN DETAIL:  The patient received intravenous antibiotics and had sequential compression devices applied to her lower extremities while in the preoperative area.  She was then taken to the operating room where spinal anesthesia was administered (epidural anesthesia was dosed up to surgical level) and was found to be adequate. She was then placed in a dorsal supine position with a leftward tilt, and prepped and draped in a sterile manner.  A foley catheter was placed  into her bladder and attached to constant gravity, which drained clear fluid throughout.  After an adequate timeout was performed, a Pfannenstiel skin incision was made with scalpel and carried through to the underlying layer of fascia. The fascia was incised in the midline and this incision was extended bilaterally bluntly Kocher clamps were applied to the superior aspect of the fascial incision and the underlying rectus muscles were dissected off bluntly. The rectus muscles were separated in the midline bluntly and the peritoneum was entered bluntly. An Alexis retractor was placed to aid in visualization of the uterus.  Attention was turned to the lower uterine segment where a transverse hysterotomy was made with a scalpel and extended bilaterally bluntly. The infant was successfully delivered, and cord was clamped and cut and infant was handed over to awaiting neonatology team. ABG obtained, pH 7.1.   Uterine massage was then administered and the placenta delivered intact with three-vessel cord. The uterus was then cleared of clot and debris.  The hysterotomy was closed with 0 Vicryl in a running locked fashion with a single layer closure. Overall, excellent hemostasis was noted. The abdomen and the pelvis were cleared of all clot and debris and the Jon Gills was removed. Hemostasis was confirmed on all surfaces.  The peritoneum was reapproximated using 2-0 vicryl running stitches. The fascia was then closed using 0 Vicryl in a running fashion. The skin was closed with 4-0 vicryl. The patient tolerated the procedure well. Sponge, lap, instrument and needle counts were correct x 2. She was taken to the recovery room in stable condition.    Joyce Stack, DO 05/13/2021 9:16 AM

## 2021-05-13 NOTE — Progress Notes (Signed)
With aid of interpreter, reviewed options of pushing without complete dilation and risks associated versus c-section. Pt elects for c-section.   The risks of surgery were discussed with the patient including but were not limited to: bleeding which may require transfusion or reoperation; infection which may require antibiotics; injury to bowel, bladder, ureters or other surrounding organs; injury to the fetus; need for additional procedures including hysterectomy in the event of a life-threatening hemorrhage; formation of adhesions; placental abnormalities with subsequent pregnancies; incisional problems; thromboembolic phenomenon and other postoperative/anesthesia complications.    The patient concurred with the proposed plan, giving informed written consent for the procedure.   Anesthesia and OR aware. Preoperative prophylactic antibiotics and SCDs ordered on call to the OR.  To OR when ready.   Milas Hock, MD Attending Obstetrician & Gynecologist, [facility] for [facility], [facility]

## 2021-05-13 NOTE — Progress Notes (Signed)
Joyce Campbell is a 41 y.o. [id number] at [redacted]w[redacted]d by LMP admitted for induction of labor due to non-reassuring fetal testing (BPP 4/10 with absent fetal tone, absent fetal movement and nonreactive NST).  Subjective: More comfortable with epidural in place, laying on side with abnormal protrusion along lower abdomen concerning for distended bladder. Foley placed and bladder improved after 1000 cc UOP. Patient with no complaints.   Objective: BP 113/75    Pulse 90    Temp 98.3 F (36.8 C) (Oral)    Resp 17    Ht 5' 2.6" (1.59 m)    Wt 102.1 kg    LMP 09/02/2020    SpO2 96%    BMI 40.37 kg/m  No intake/output data recorded. Total I/O In: -  Out: 1000 [Urine:1000]  FHT:  FHR: 140 bpm, variability: minimal ,  accelerations:  Abscent,  decelerations:  Present Variable UC:   regular, every 2-4 minutes SVE:   Dilation: Lip/rim Effacement (%): 70 Station: -1 Exam by:: [place] B RN  Labs: Lab Results  Component Value Date   WBC 6.3 05/12/2021   HGB 11.3 (L) 05/12/2021   HCT 34.3 (L) 05/12/2021   MCV 88.6 05/12/2021   PLT 207 05/12/2021    Assessment / Plan: Induction of labor due to non-reassuring fetal testing,  progressing well   Labor:  S/p IUPC and FSE placement. Anterior lip remains bulbous and large at this time but soft. Suspect bladder distension inhibited fetal descent.   Fetal Wellbeing:  Category II Pain Control:  Epidural I/D:   Penicillin for GBS ppx and preterm Anticipated MOD:  NSVD  [name], MD 05/13/2021, 6:19 AM

## 2021-05-13 NOTE — Progress Notes (Signed)
Joyce Campbell is a 41 y.o. [id number] at [redacted]w[redacted]d by LMP admitted for induction of labor due to non-reassuring fetal testing (BPP 4/10 with absent fetal tone, absent fetal movement and nonreactive NST).  Subjective: More comfortable with epidural in place  Objective: BP (!) 92/58    Pulse 92    Temp 98.2 F (36.8 C) (Oral)    Resp 17    Ht 5' 2.6" (1.59 m)    Wt 102.1 kg    LMP 09/02/2020    SpO2 96%    BMI 40.37 kg/m  I/O last 3 completed shifts: In: -  Out: 1000 [Urine:1000] No intake/output data recorded.  FHT:  FHR: 140 bpm, variability: minimal with periods of moderate,  accelerations:  Absent,  decelerations:  Present - intermittent variable and late decelerations with some contractions UC:   regular, every 2-4 minutes SVE:   Dilation: Lip/rim Effacement (%): 70 Station: -1 Exam by:: Dr. Damita Dunnings  Labs: Lab Results  Component Value Date   WBC 6.3 05/12/2021   HGB 11.3 (L) 05/12/2021   HCT 34.3 (L) 05/12/2021   MCV 88.6 05/12/2021   PLT 207 05/12/2021    Assessment / Plan: Induction of labor due to non-reassuring fetal testing,  progressing well   Labor:  S/p IUPC and FSE placement. Anterior lip remains bulbous and large at this time but soft. Unable to safely augment. I suspect some degree of asynclitic due to poor tone (related to BPP and indication for delivery).  Fetal Wellbeing:  Category II Pain Control:  Epidural I/D:   Penicillin for GBS ppx and preterm Anticipated MOD:  With aid of interpreter, we will consent the patient for all forms of delivery and I think based on cervix reducing on exam, she could try pushing. However, if NRFHT and still not able to do operative delivery, then she would need an emergency c-section. I will also consent her for c-section before pushing and also offer c-section now without trial of pushing.   Radene Gunning, MD 05/13/2021, 7:21 AM

## 2021-05-13 NOTE — Anesthesia Preprocedure Evaluation (Signed)
Anesthesia Evaluation  Patient identified by MRN, date of birth, ID band Patient awake    Reviewed: Allergy & Precautions, H&P , NPO status , Patient's Chart, lab work & pertinent test results  History of Anesthesia Complications Negative for: history of anesthetic complications  Airway Mallampati: II  TM Distance: >3 FB     Dental   Pulmonary neg pulmonary ROS,    Pulmonary exam normal        Cardiovascular negative cardio ROS   Rhythm:regular Rate:Normal     Neuro/Psych negative neurological ROS  negative psych ROS   GI/Hepatic negative GI ROS, Neg liver ROS,   Endo/Other  diabetes, GestationalMorbid obesity  Renal/GU      Musculoskeletal   Abdominal   Peds  Hematology negative hematology ROS (+)   Anesthesia Other Findings   Reproductive/Obstetrics (+) Pregnancy                             Anesthesia Physical Anesthesia Plan  ASA: 3  Anesthesia Plan: Epidural   Post-op Pain Management:    Induction:   PONV Risk Score and Plan:   Airway Management Planned:   Additional Equipment:   Intra-op Plan:   Post-operative Plan:   Informed Consent: I have reviewed the patients History and Physical, chart, labs and discussed the procedure including the risks, benefits and alternatives for the proposed anesthesia with the patient or authorized representative who has indicated his/her understanding and acceptance.       Plan Discussed with:   Anesthesia Plan Comments:         Anesthesia Quick Evaluation  

## 2021-05-13 NOTE — Progress Notes (Signed)
Joyce Campbell is a 41 y.o. [id number] at [redacted]w[redacted]d by LMP admitted for induction of labor due to non-reassuring fetal testing (BPP 4/10 with absent fetal tone, absent fetal movement and nonreactive NST).  Subjective: Having significantly painful CTXs more frequently and continues to breathe through them. Has gone back and forth regarding epidural, currently declining it. Edd Arbour, CNM to check cervical dilation, baby remains high in station at this time. Moderate-sized cervical lip noted on exam, concerning for obstruction of decent of fetal head.   Objective: BP 115/63    Pulse (!) 104    Temp 98.3 F (36.8 C) (Oral)    Resp 17    Ht 5' 2.6" (1.59 m)    Wt 102.1 kg    LMP 09/02/2020    BMI 40.37 kg/m  No intake/output data recorded. No intake/output data recorded.  FHT:  FHR: 145 bpm, variability: moderate,  accelerations:  Present,  decelerations:  Present intermittent early and variable decelerations UC:   regular, every 2-5 minutes SVE:   Dilation: Lip/rim Effacement (%): 70 Station: -1 Exam by:: Edd Arbour, CNM  Labs: Lab Results  Component Value Date   WBC 6.3 05/12/2021   HGB 11.3 (L) 05/12/2021   HCT 34.3 (L) 05/12/2021   MCV 88.6 05/12/2021   PLT 207 05/12/2021    Assessment / Plan: Induction of labor due to non-reassuring fetal testing,  progressing well on pitocin  Labor:  Progressing well off pitocin with good CTX pattern and good cervical change. Anterior lip obstructing fetal descent, will trial Benadryl Fetal Wellbeing:  Category I  Pain Control:  Labor support without medications I/D:   Penicillin for preterm and GBS ppx Anticipated MOD:  NSVD  Raylene Everts, MD 05/13/2021, 4:17 AM

## 2021-05-13 NOTE — Transfer of Care (Signed)
Immediate Anesthesia Transfer of Care Note  Patient: Joyce Campbell  Procedure(s) Performed: CESAREAN SECTION  Patient Location: PACU  Anesthesia Type:Epidural  Level of Consciousness: awake, alert  and oriented  Airway & Oxygen Therapy: Patient Spontanous Breathing  Post-op Assessment: Report given to RN and Post -op Vital signs reviewed and stable  Post vital signs: Reviewed and stable  Last Vitals:  Vitals Value Taken Time  BP 109/62 05/13/21 0925  Temp    Pulse 88 05/13/21 0928  Resp 23 05/13/21 0928  SpO2 99 % 05/13/21 0928  Vitals shown include unvalidated device data.  Last Pain:  Vitals:   05/13/21 0730  TempSrc:   PainSc: 0-No pain         Complications: No notable events documented.

## 2021-05-13 NOTE — Lactation Note (Addendum)
This note was copied from a baby's chart. Lactation Consultation Note  Patient Name: Joyce Campbell [id number] Date: 05/13/2021 Reason for consult: Initial assessment;NICU baby;Late-preterm 34-36.6wks;Other (Comment);Maternal endocrine disorder (AMA, LGA) Age:41 hours  LC and student met with patient at bedside. Patient native language is [place]. Interpreter on a stick used during encounter #[id number], [employer].   Patient reports she has not started pumping yet, pump not set up. Student set up pump, provided education including pumping frequency, cleaning, and milk storage. Informed that stimulation is essential even if output not observed. Patient taught hand expression, able to demonstrate comprehension of education to student by hand expressing while student observes. Flange fitting, 24;   Patient reports having breastfed her other children for a year to a year and a half, but states that she did not have enough milk. Student congratulated her on breastfeeding for over one year. Patient reports she intends to feed this child for at least that length of time as well. Infant is NPO currently, but patient desires to eventually bring baby to breast. Encouraged to continue pumping to establish and maintain milk supply until baby is able to nurse at breast.   Patient does not have a pump at home. Reports she has [place] through [facility], referral sent. [facility] RN [name] placed an order for [facility] pump, however it was denied due to Medicaid status. Encouraged to take breast pump kit with her to baby room on NICU to pump while in room upon discharge.    Maternal Data Has patient been taught Hand Expression?: Yes Does the patient have breastfeeding experience prior to this delivery?: Yes How long did the patient breastfeed?: Patient reported breastfeeding for 1-1.5 years with other 3 children, but also reported that she didn't have enough milk  Lactation Tools  Discussed/Used Tools: Pump;Coconut oil;Flanges Flange Size: 24 Breast pump type: Double-Electric Breast Pump Pump Education: Setup, frequency, and cleaning;Milk Storage Reason for Pumping: NICU Infant Pumping frequency: q3 hours (recommended) Pumped volume:  (droplets)  Interventions Interventions: Breast massage;Hand express;DEBP;Coconut oil;"The NICU and Your Baby" book;Education;Expressed milk;LC Services brochure  Plan Patient to pump q3 hours, with goal of 8+ pump sessions in 24 hours  Patient to contact [place] for questions/concerns LC to follow up   Discharge Pump: DEBP WIC Program: Yes  Consult Status Consult Status: Follow-up Date: 05/13/21 Follow-up type: In-patient   Joyce Campbell 05/13/2021, 1:48 PM

## 2021-05-13 NOTE — Progress Notes (Signed)
Aneeka Eshbaugh is a 41 y.o. [id number] at [redacted]w[redacted]d by LMP admitted for induction of labor due to non-reassuring fetal testing (BPP 4/10 with absent fetal tone, absent fetal movement and nonreactive NST).  Subjective: In to assess patient. FB out. Noted to be coming out of a deep late deceleration after position changes. Pitocin stopped, IVF bolus running. Patient continues to breathe well through CTXs, which are occurring more frequently and are more severe. Noted to have cervical change on exam.   Objective: BP 137/86    Pulse 97    Temp 98.2 F (36.8 C)    Resp 17    Ht 5' 2.6" (1.59 m)    Wt 102.1 kg    LMP 09/02/2020    BMI 40.37 kg/m  No intake/output data recorded. No intake/output data recorded.  FHT:  FHR: 140 bpm, variability: moderate,  accelerations:  Present,  decelerations:  Present deep late variable noted with good recovery after position changed UC:   regular, every 3-5 minutes SVE:   Dilation: 6 Effacement (%): 70 Station: -2 Exam by:: Gildardo Pounds, MD  Labs: Lab Results  Component Value Date   WBC 6.3 05/12/2021   HGB 11.3 (L) 05/12/2021   HCT 34.3 (L) 05/12/2021   MCV 88.6 05/12/2021   PLT 207 05/12/2021    Assessment / Plan: Induction of labor due to non-reassuring fetal testing.  Labor:  Deep late deceleration noted now with good recovery after position changing as well as pitocin stopped and IVF bolus. Will continue to monitor at this time to ensure no recurrent fetal distress then plan to restart pitocin with max at 4 to improve contraction pattern.  Gestational DM: BG 68-104, continue to monitor q4 Fetal Wellbeing:  Category II Pain Control:  Labor support without medications I/D:   Penicillin for pre-term and GBS ppx Anticipated MOD:  NSVD  [name], MD 05/13/2021, 12:41 AM

## 2021-05-13 NOTE — Anesthesia Postprocedure Evaluation (Signed)
Anesthesia Post Note  Patient: Joyce Campbell  Procedure(s) Performed: [place]     Patient location during evaluation: PACU Anesthesia Type: Epidural Level of consciousness: awake and alert and oriented Pain management: pain level controlled Vital Signs Assessment: post-procedure vital signs reviewed and stable Respiratory status: spontaneous breathing, nonlabored ventilation and respiratory function stable Cardiovascular status: blood pressure returned to baseline and stable Postop Assessment: no headache, no backache, epidural receding and no apparent nausea or vomiting Anesthetic complications: no   No notable events documented.  Last Vitals:  Vitals:   05/13/21 0946 05/13/21 0947  BP:    Pulse: 89 91  Resp: (!) 24 16  Temp:    SpO2: 96% 94%    Last Pain:  Vitals:   05/13/21 0929  TempSrc:   PainSc: 0-No pain   Pain Goal:    LLE Motor Response: Purposeful movement (05/13/21 0929) LLE Sensation: Decreased (05/13/21 0929) RLE Motor Response: Purposeful movement (05/13/21 0929) RLE Sensation: Decreased (05/13/21 0929)     Epidural/Spinal Function Cutaneous sensation: Able to Discern Pressure (05/13/21 0929), Patient able to flex knees: Yes (05/13/21 0929), Patient able to lift hips off bed: No (05/13/21 0929), Back pain beyond tenderness at insertion site: No (05/13/21 0929), Progressively worsening motor and/or sensory loss: No (05/13/21 0929), Bowel and/or bladder incontinence post epidural: No (05/13/21 0929)  Pervis Hocking

## 2021-05-13 NOTE — Addendum Note (Signed)
Addendum  created 05/13/21 1122 by Lyndsi Altic, Waynard Reeds, CRNA   Intraprocedure Meds edited

## 2021-05-13 NOTE — Discharge Summary (Signed)
Postpartum Discharge Summary  Date of Service updated 05/15/21     Patient Name: Joyce Campbell DOB: 1981-04-17 MRN: [id number]  Date of admission: 05/12/2021 Delivery date:05/13/2021  Delivering provider: Truett Mainland  Date of discharge: 05/15/2021  Admitting diagnosis: Gestational diabetes [O24.419] Intrauterine pregnancy: [redacted]w[redacted]d     Secondary diagnosis:  Principal Problem:   Gestational diabetes Active Problems:   Fetus with chance for Trisomy 55   Late prenatal care   Psychosocial stressors   AMA (advanced maternal age) multigravida 35+, third trimester   Fetal VSD (ventricular septal defect), perimembranous   Pregnancy complicated by fetal cerebral ventriculomegaly   LGA (large for gestational age) fetus affecting management of mother   Polyhydramnios in third trimester   GDM (gestational diabetes mellitus)  Additional problems: NA    Discharge diagnosis: Preterm Pregnancy Delivered and GDM A1                                              Post partum procedures: NA Augmentation: AROM, Cytotec, and IP Foley Complications: None  Hospital course: Induction of Labor With Cesarean Section   41 y.o. yo [id number] at [redacted]w[redacted]d was admitted to the hospital 05/12/2021 for induction of labor due to [facility] 4/10. Patient had a labor course significant for progression to 9.5cm with asynclitic positioning and persistent Cat II strip. The patient went for cesarean section due to Non-Reassuring FHR. Delivery details are as follows: Membrane Rupture Time/Date: 1:42 AM ,05/13/2021   Delivery Method:C-Section, Low Transverse  Details of operation can be found in separate operative Note.  Patient had an uncomplicated postpartum course. She is ambulating, tolerating a regular diet, passing flatus, and urinating well.  Patient is discharged home in stable condition on 05/15/21.      Newborn Data: Birth date:05/13/2021  Birth time:8:33 AM  Gender:Female  Living status:Living  Apgars:5 ,7   Weight:4200 g                                Magnesium Sulfate received: No BMZ received: No Rhophylac:No MMR:No T-DaP:Given prenatally Flu: No Transfusion:No  Physical exam  Vitals:   05/14/21 1553 05/14/21 2006 05/15/21 0533 05/15/21 0745  BP: 127/72 119/76 115/70 119/70  Pulse: (!) 101 90 87 86  Resp: $Remo'16 16 17 18  'dEbFz$ Temp: 98.1 F (36.7 C) 98.1 F (36.7 C) 98.1 F (36.7 C) 98.5 F (36.9 C)  TempSrc: Oral Oral Oral Oral  SpO2: 100% 100% 97%   Weight:      Height:       General: alert Lochia: appropriate Uterine Fundus: firm Incision: Healing well with no significant drainage DVT Evaluation: No evidence of DVT seen on physical exam. Labs: Lab Results  Component Value Date   WBC 11.2 (H) 05/14/2021   HGB 9.9 (L) 05/14/2021   HCT 30.1 (L) 05/14/2021   MCV 88.8 05/14/2021   PLT 190 05/14/2021   No flowsheet data found. Edinburgh Score: Edinburgh Postnatal Depression Scale Screening Tool 05/14/2021  I have been able to laugh and see the funny side of things. 0  I have looked forward with enjoyment to things. 0  I have blamed myself unnecessarily when things went wrong. 2  I have been anxious or worried for no good reason. 0  I have felt scared or panicky for no  good reason. 0  Things have been getting on top of me. 1  I have been so unhappy that I have had difficulty sleeping. 0  I have felt sad or miserable. 0  I have been so unhappy that I have been crying. 0  The thought of harming myself has occurred to me. 0  Edinburgh Postnatal Depression Scale Total 3     After visit meds:  Allergies as of 05/15/2021   No Known Allergies      Medication List     STOP taking these medications    Accu-Chek Guide test strip Generic drug: glucose blood   Accu-Chek Softclix Lancets lancets   aspirin 81 MG EC tablet       TAKE these medications    ibuprofen 600 MG tablet Commonly known as: ADVIL Take 1 tablet (600 mg total) by mouth every 6 (six) hours.    oxyCODONE 5 MG immediate release tablet Commonly known as: Oxy IR/ROXICODONE Take 1 tablet (5 mg total) by mouth every 4 (four) hours as needed for moderate pain.   prenatal vitamin w/FE, FA 27-1 MG Tabs tablet Take 1 tablet by mouth daily at 12 noon.               Durable Medical Equipment  (From admission, onward)           Start     Ordered   05/13/21 1512  For home use only DME double electric breast pump  Once        05/13/21 1511   05/13/21 1409  For home use only DME double electric breast pump  Once       Comments: Z39.1   05/13/21 1409              Discharge Care Instructions  (From admission, onward)           Start     Ordered   05/15/21 0000  Discharge wound care:       Comments: Remove dressing on 05/18/21   05/15/21 0941             Discharge home in stable condition Infant Feeding: Bottle Infant Disposition:NICU Discharge instruction: per After Visit Summary and Postpartum booklet. Activity: Advance as tolerated. Pelvic rest for 6 weeks.  Diet: routine diet Future Appointments: Future Appointments  Date Time Provider [place]  05/20/2021  2:30 PM [facility] NURSE [facility]  06/24/2021  8:20 AM [facility]  06/24/2021  9:55 AM Darlina Rumpf, CNM [facility]   Follow up Visit:  [place] for [facility] Follow up.   Specialty: Obstetrics and Gynecology Contact information: [place] [address] [phone]                Message sent to [facility] by Dr Higinio Plan  Please schedule this patient for a In person postpartum visit in 6 weeks with the following provider: Any provider. Additional Postpartum F/U:2 hour GTT and Incision check 1 week  High risk pregnancy complicated by: GDM and fetal abnormalities Delivery mode:  C-Section, Low Transverse  Anticipated Birth Control:  Unsure   05/15/2021 Chancy Milroy, MD

## 2021-05-13 NOTE — Anesthesia Procedure Notes (Signed)
Epidural Patient location during procedure: OB Start time: 05/13/2021 4:43 AM End time: 05/13/2021 4:52 AM  Staffing Anesthesiologist: Lucretia Kern, MD Performed: anesthesiologist   Preanesthetic Checklist Completed: patient identified, IV checked, risks and benefits discussed, monitors and equipment checked, pre-op evaluation and timeout performed  Epidural Patient position: sitting Prep: DuraPrep Patient monitoring: heart rate, continuous pulse ox and blood pressure Approach: midline Location: L3-L4 Injection technique: LOR air  Needle:  Needle type: Tuohy  Needle gauge: 17 G Needle length: 9 cm Needle insertion depth: 6 cm Catheter type: closed end flexible Catheter size: 19 Gauge Catheter at skin depth: 11 cm Test dose: negative  Assessment Events: blood not aspirated, injection not painful, no injection resistance, no paresthesia and negative IV test  Additional Notes Reason for block:procedure for pain

## 2021-05-14 LAB — CULTURE, BETA STREP (GROUP B ONLY)

## 2021-05-14 LAB — CBC
HCT: 30.1 % — ABNORMAL LOW (ref 36.0–46.0)
Hemoglobin: 9.9 g/dL — ABNORMAL LOW (ref 12.0–15.0)
MCH: 29.2 pg (ref 26.0–34.0)
MCHC: 32.9 g/dL (ref 30.0–36.0)
MCV: 88.8 fL (ref 80.0–100.0)
Platelets: 190 10*3/uL (ref 150–400)
RBC: 3.39 MIL/uL — ABNORMAL LOW (ref 3.87–5.11)
RDW: 14 % (ref 11.5–15.5)
WBC: 11.2 10*3/uL — ABNORMAL HIGH (ref 4.0–10.5)
nRBC: 0 % (ref 0.0–0.2)

## 2021-05-14 LAB — SURGICAL PATHOLOGY

## 2021-05-14 NOTE — Progress Notes (Signed)
Subjective: Postpartum Day 1: Cesarean Delivery Patient has no complaints this morning. Voiding without problems. Tolerating diet. Good oral pain control.    Objective: Vital signs in last 24 hours: Temp:  [97.6 F (36.4 C)-98.3 F (36.8 C)] 98.3 F (36.8 C) (01/25 0754) Pulse Rate:  [72-94] 72 (01/25 0754) Resp:  [16] 16 (01/25 0754) BP: (94-119)/(41-70) 100/59 (01/25 0754) SpO2:  [94 %-99 %] 96 % (01/25 0754)  Physical Exam:  General: alert Lochia: appropriate Uterine Fundus: firm Incision: healing well DVT Evaluation: No evidence of DVT seen on physical exam.  Recent Labs    05/12/21 1509 05/14/21 0422  HGB 11.3* 9.9*  HCT 34.3* 30.1*    Assessment/Plan: Status post Cesarean section. Doing well postoperatively.  Continue current care.  Joyce Campbell 05/14/2021, 10:42 AM

## 2021-05-14 NOTE — Clinical Social Work Maternal (Signed)
CLINICAL SOCIAL WORK MATERNAL/CHILD NOTE  Patient Details  Name: Tiaja Hagan MRN: [id number] Date of Birth: 1981-04-19  Date:  05/14/2021  Clinical Social Worker Initiating Note:  Abundio Miu, [place] Date/Time: Initiated:  05/14/21/1132     Child's Name:  Sullivan Lone   Biological Parents:  Mother, Father (Father: Eugene Garnet)   Need for Interpreter:  [place]   Reason for Referral:  Parental Support of Children with Anomalies/Syndromes   Address:  [address] [place] [address]    Phone number:  [phone] (home)     Additional phone number:   Household Members/Support Persons (HM/SP):   Household Member/Support Person 1, Household Member/Support Person 2, Household Member/Support Person 3   HM/SP Name Relationship DOB or Age  HM/SP -1 Fabrice Boudemabia child 12/10/14  HM/SP -2 Lawanna Kobus daughter 08/31/09  HM/SP -3 Gillis Ends Boudemabia daughter 02/20/07  HM/SP -4        HM/SP -5        HM/SP -6        HM/SP -7        HM/SP -8          Natural Supports (not living in the home):  Friends   Professional Supports: None   Employment: Animator   Type of Work: Public affairs consultant   Education:  [employer] school graduate   Homebound arranged:    Museum/gallery curator Resources:  [employer]   Other Resources:  [employer], Physicist, medical     Cultural/Religious Considerations Which May Impact Care:    Strengths:  Ability to meet basic needs  , Engineer, materials, Understanding of illness   Psychotropic Medications:         Pediatrician:    Solicitor area  Pediatrician List:    [facility] ([address])  [place]      Pediatrician Fax Number:    Risk Factors/Current Problems:  None   Cognitive State:  Able to Concentrate  , Alert  , Goal Oriented  , Linear Thinking     Mood/Affect:  Comfortable  , Calm  , Interested      CSW Assessment: CSW met with MOB at infant's bedside to complete psychosocial assessment. CSW utilized [employer] [facility] language services french interpreter Theodis Shove [phone]). CSW introduced self and explained role. MOB was open, pleasant, and remained engaged during assessment. MOB reported that she resides with her three older children. MOB shared that FOB is in [place]. 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 MOB reported that she has some items for infant including a car seat and baby bed. CSW informed MOB about [facility] Support Network [employer] if assistance is needed obtaining items for infant. MOB reported that assistance with essential items would be helpful, CSW agreed to make a referral. CSW inquired about MOB's support system, MOB reported that she has a friend who is a support.    CSW inquired about MOB's mental health history. MOB denied any mental health history and denied any postpartum depression history. CSW inquired about how MOB was feeling emotionally after giving birth, MOB reported that she was not happy since baby was not doing well. CSW acknowledged, normalized, and validated MOB's feelings. Attending Neonatologist came in and provided update and answered MOB's questions. Neonatologist left after speaking with MOB. MOB presented calm and did not demonstrate  any acute mental health signs/symptoms. CSW assessed for safety, MOB denied SI, HI, and domestic violence.   CSW provided education regarding the baby blues period vs. perinatal mood disorders, discussed treatment and gave resources for mental health follow up if concerns arise.  CSW recommends self-evaluation during the postpartum time period using the New Mom Checklist from Postpartum Progress and encouraged MOB to contact a medical professional if symptoms are noted at any time.    CSW did not provide a review of Sudden Infant Death Syndrome (SIDS) precautions at this time. SIDS  education will be provided prior to discharge.   CSW and MOB discussed infant's NICU admission. CSW informed MOB about the NICU, what to expect, and support/resources available while infant is admitted to the NICU. MOB reported that she feels well informed about infant's care. MOB denied any transportation barriers with visiting infant in the NICU, noting she drives. MOB denied any questions/concerns regarding the NICU. CSW inquired about any stressors, MOB reported none.   Lactation came into the room to speak with MOB. CSW agreed to meet with MOB at a later time to go over additional information (SSI benefits) due to lactation waiting to speak with MOB.   CSW will continue to offer resources/supports while infant is admitted to the NICU.    CSW Plan/Description:  Perinatal Mood and Anxiety Disorder (PMADs) Education, Psychosocial Support and Ongoing Assessment of Needs, Other Information/Referral to [employer], Other Patient/Family Education    Burnis Medin, LCSW 05/14/2021, 11:37 AM

## 2021-05-14 NOTE — Lactation Note (Signed)
This note was copied from a baby's chart.  NICU Lactation Consultation Note  Patient Name: Joyce Campbell STMHD'Q Date: 05/14/2021 Age:41 hours   Subjective Reason for consult: Follow-up assessment Mother continues to pump frequently. She denies breast discomfort or difficulty pumping.   Objective Infant data:   Maternal data: [id number]  C-Section, Low Transverse  Previous breastfeeding challenges?: Other (Comment) (reported that she didn't have enough milk despite having breastfed for over one year)  Does the patient have breastfeeding experience prior to this delivery?: Yes How long did the patient breastfeed?: Patient reported breastfeeding for 1-1.5 years with other 3 children, but also reported that she didn't have enough milk  Pumping frequency: q3 Pumped volume: 0 mL (droplets) Flange Size: 24   WIC Program: Yes WIC Referral Sent?: Yes Pump: DEBP  Assessment Infant:  Feeding Status: NPO   Maternal: Milk volume: Normal   Intervention/Plan Interventions: Education (interpreter services)  Tools: Pump; Coconut oil; Flanges Pump Education: Setup, frequency, and cleaning; Milk Storage  Plan: Consult Status: Follow-up  NICU Follow-up type: Verify absence of engorgement; Verify onset of copious milk  Mother will continue to pump q3 and bring any EBM to NICU.  Mother will f/u with [facility] for pump. LC to verify pump issuance.   Elder Negus 05/14/2021, 12:01 PM

## 2021-05-15 LAB — GLUCOSE, CAPILLARY
Glucose-Capillary: 103 mg/dL — ABNORMAL HIGH (ref 70–99)
Glucose-Capillary: 59 mg/dL — ABNORMAL LOW (ref 70–99)

## 2021-05-15 MED ORDER — IBUPROFEN 600 MG PO TABS: 600.0000 mg | ORAL_TABLET | Freq: Four times a day (QID) | ORAL | 0 refills | Status: AC

## 2021-05-15 MED ORDER — OXYCODONE HCL 5 MG PO TABS: 5.0000 mg | ORAL_TABLET | ORAL | 0 refills | Status: AC | PRN

## 2021-05-15 NOTE — Progress Notes (Signed)
Discharge information provided to patient with help [facility] [phone]. Follow-up appointments, medications, when to call the provider, and c/s recovery discussed, and patient verbalized understanding. Honeycomb dressing replaced per verbal from Dr. Alysia Penna. Patient alert and oriented x4, ambulatory, and VS and pain stable. Patient states will call RN when ready to go and will need help taking personal items to NICU.

## 2021-05-15 NOTE — Plan of Care (Signed)
?  Problem: Education: ?Goal: Knowledge of General Education information will improve ?Description: Including pain rating scale, medication(s)/side effects and non-pharmacologic comfort measures ?Outcome: Adequate for Discharge ?  ?Problem: Health Behavior/Discharge Planning: ?Goal: Ability to manage health-related needs will improve ?Outcome: Adequate for Discharge ?  ?Problem: Clinical Measurements: ?Goal: Ability to maintain clinical measurements within normal limits will improve ?Outcome: Adequate for Discharge ?Goal: Will remain free from infection ?Outcome: Adequate for Discharge ?Goal: Diagnostic test results will improve ?Outcome: Adequate for Discharge ?Goal: Respiratory complications will improve ?Outcome: Adequate for Discharge ?Goal: Cardiovascular complication will be avoided ?Outcome: Adequate for Discharge ?  ?Problem: Activity: ?Goal: Risk for activity intolerance will decrease ?Outcome: Adequate for Discharge ?  ?Problem: Nutrition: ?Goal: Adequate nutrition will be maintained ?Outcome: Adequate for Discharge ?  ?Problem: Coping: ?Goal: Level of anxiety will decrease ?Outcome: Adequate for Discharge ?  ?Problem: Elimination: ?Goal: Will not experience complications related to bowel motility ?Outcome: Adequate for Discharge ?Goal: Will not experience complications related to urinary retention ?Outcome: Adequate for Discharge ?  ?Problem: Pain Managment: ?Goal: General experience of comfort will improve ?Outcome: Adequate for Discharge ?  ?Problem: Safety: ?Goal: Ability to remain free from injury will improve ?Outcome: Adequate for Discharge ?  ?Problem: Skin Integrity: ?Goal: Risk for impaired skin integrity will decrease ?Outcome: Adequate for Discharge ?  ?Problem: Education: ?Goal: Knowledge of Childbirth will improve ?Outcome: Adequate for Discharge ?Goal: Ability to make informed decisions regarding treatment and plan of care will improve ?Outcome: Adequate for Discharge ?Goal: Ability to state  and carry out methods to decrease the pain will improve ?Outcome: Adequate for Discharge ?Goal: Individualized Educational Video(s) ?Outcome: Adequate for Discharge ?  ?Problem: Coping: ?Goal: Ability to verbalize concerns and feelings about labor and delivery will improve ?Outcome: Adequate for Discharge ?  ?Problem: Life Cycle: ?Goal: Ability to make normal progression through stages of labor will improve ?Outcome: Adequate for Discharge ?Goal: Ability to effectively push during vaginal delivery will improve ?Outcome: Adequate for Discharge ?  ?Problem: Role Relationship: ?Goal: Will demonstrate positive interactions with the child ?Outcome: Adequate for Discharge ?  ?Problem: Safety: ?Goal: Risk of complications during labor and delivery will decrease ?Outcome: Adequate for Discharge ?  ?Problem: Pain Management: ?Goal: Relief or control of pain from uterine contractions will improve ?Outcome: Adequate for Discharge ?  ?Problem: Education: ?Goal: Knowledge of condition will improve ?Outcome: Adequate for Discharge ?Goal: Individualized Educational Video(s) ?Outcome: Adequate for Discharge ?Goal: Individualized Newborn Educational Video(s) ?Outcome: Adequate for Discharge ?  ?Problem: Activity: ?Goal: Will verbalize the importance of balancing activity with adequate rest periods ?Outcome: Adequate for Discharge ?Goal: Ability to tolerate increased activity will improve ?Outcome: Adequate for Discharge ?  ?Problem: Coping: ?Goal: Ability to identify and utilize available resources and services will improve ?Outcome: Adequate for Discharge ?  ?Problem: Life Cycle: ?Goal: Chance of risk for complications during the postpartum period will decrease ?Outcome: Adequate for Discharge ?  ?Problem: Role Relationship: ?Goal: Ability to demonstrate positive interaction with newborn will improve ?Outcome: Adequate for Discharge ?  ?Problem: Skin Integrity: ?Goal: Demonstration of wound healing without infection will  improve ?Outcome: Adequate for Discharge ?  ?

## 2021-05-15 NOTE — Progress Notes (Signed)
Date and time results received: 05/15/21  (use smartphrase ".now" to insert current time)  Test:cbg  Critical Value:59 Snack given Retest 103  Name of Provider Notified:Dr. Anyanwu  Orders Received? Or Actions Taken? No new orders

## 2021-05-15 NOTE — Lactation Note (Signed)
This note was copied from a baby's chart. Lactation Consultation Note  Patient Name: Joyce Campbell VEHMC'N Date: 05/15/2021 Reason for consult: Follow-up assessment Age:41 hours  Interpretor: Annabelle #[id number] - I followed up with Ms. Ciccarelli with assistance of an interpretor. We reviewed how to use her DEBP. She is being discharged today, and she plans to go home tonight. I showed her how to use the initiation setting on her DEBP (She was using the expression setting), and I resized her flanges to size 27.   She hand expressed, and it appears that her milk is transitioning. She has an appointment tomorrow at 1300 with WIC, and to help her hrough the night, I showed her how to use her manual pump.  I brought her additional storage bottles, and I called the NICU RN to request that some labels be brought to baby's room. I recommended that Ms. Deupree label her pumped milk.  Plan: Pump the breasts every 2-3 hours. Double pump for 15 minutes or manual pump each breasts for 10 minutes (each). Follow up with [facility] and obtain a loaner pump on 1/27. Call lactation for assistance with breast feeding (patient intends on breastfeeding).  Maternal Data Has patient been taught Hand Expression?: Yes Does the patient have breastfeeding experience prior to this delivery?: Yes How long did the patient breastfeed?: 1+ years  Lactation Tools Discussed/Used Tools: Pump;Flanges Flange Size: 27 (was using a 24) Breast pump type: Double-Electric Breast Pump Pump Education: Setup, frequency, and cleaning;Milk Storage Reason for Pumping: NICU Pumping frequency: rec q3 hours Pumped volume: 0 mL (droplets)  Interventions Interventions: Breast feeding basics reviewed;Hand pump;Education;DEBP;Hand express  Discharge Discharge Education: Engorgement and breast care Pump: Manual (Appt with WIC on 1/27 at 1300)  Consult Status Consult Status: Follow-up Date: 05/15/21 Follow-up type:  In-patient    Walker Shadow 05/15/2021, 2:30 PM

## 2021-05-18 ENCOUNTER — Ambulatory Visit: Payer: Self-pay

## 2021-05-18 NOTE — Lactation Note (Signed)
This note was copied from a baby's chart. Lactation Consultation Note  Patient Name: Joyce Campbell BEMLJ'Q Date: 05/18/2021 Reason for consult: Follow-up assessment;Other (Comment);NICU baby;Late-preterm 34-36.6wks;Maternal endocrine disorder (Trisomy 21, LGA, AMA) Age:41 days  Used Multimedia programmer for [place], interpreter # [phone] "[name]".  Visited with mom of 64 13/40 days old (adjusted) NICU female, she voiced she's only been pumping when she comes to visit baby at the hospital because the hand pump doesn't work as well for milk removal and she missed her appt she had at the [facility] office on Friday (she was sick); she was supposed to pick up her pump then.  Offered a [facility] loaner, pump # [id number] was issued; mom didn't have change, she put a $ 40.00 deposit. Reviewed pumping schedule, lactogenesis II and expectations. No S/S of engorgement at this point, mom pumping during [facility] consultation.  Maternal Data  Mom's milk is in coming in but she's at risk of delayed lactogenesis due to decreased pumping  Feeding Mother's Current Feeding Choice: Breast Milk  Lactation Tools Discussed/Used Tools: Pump;Flanges Flange Size: 27 Breast pump type: Double-Electric Breast Pump Pump Education: Setup, frequency, and cleaning;Milk Storage Reason for Pumping: LPI in NICU Pumping frequency: only pumping when she's at the hospital, she only visits once/day Pumped volume: 40 mL  Interventions Interventions: Breast feeding basics reviewed;DEBP;Education  Plan of care Encouraged mom to start pumping consistently every 3 hours, at least 8 pumping sessions/24 hours She'll call the [facility] office on Monday to schedule another appt for pump P/U She's due to return her loaner pump to [facility]U on 05/30/21  No other support person at this time. All questions and concerns answered, mom to call NICU LC PRN.  Discharge Pump: DEBP;WIC Loaner  Consult Status Consult Status: Follow-up Date:  05/18/21 Follow-up type: In-patient   Reagyn Facemire Venetia Constable 05/18/2021, 6:25 PM

## 2021-05-20 ENCOUNTER — Ambulatory Visit (INDEPENDENT_AMBULATORY_CARE_PROVIDER_SITE_OTHER): Payer: Medicaid Other

## 2021-05-20 ENCOUNTER — Other Ambulatory Visit: Payer: Self-pay

## 2021-05-20 VITALS — BP 149/77 | HR 77 | Wt 216.4 lb

## 2021-05-20 DIAGNOSIS — R030 Elevated blood-pressure reading, without diagnosis of hypertension: Secondary | ICD-10-CM

## 2021-05-20 MED ORDER — NIFEDIPINE ER OSMOTIC RELEASE 30 MG PO TB24: 30.0000 mg | ORAL_TABLET | Freq: Every day | ORAL | 0 refills | Status: AC

## 2021-05-20 NOTE — Progress Notes (Signed)
Patient here today for incision check from c-section on 05/13/21. Patient's honeycomb dressing was removed using adhesive remover. Incision site was observed and assessed to be healing well. No redness, bruising or drainage noted. Patient's BP today was 146/77. Patient denies headache, dizziness, shortness of breath. BP was rechecked approximately 15 minutes later and found to be 149/77. Reviewed this with Dr. Dione Plover. Per Dr. Dione Plover a CBC and CMP should be drawn. Per Dr. Dione Plover patient should begin taking Nifedipine 30 mg daily and should have BP rechecked in a week. Medication sent to patient's pharmacy. I reviewed this plan with the patient. Patient denies any further questions or concerns.    Paulina Fusi, RN 05/20/21

## 2021-05-21 ENCOUNTER — Telehealth: Payer: Self-pay

## 2021-05-21 ENCOUNTER — Ambulatory Visit: Payer: Medicaid Other

## 2021-05-21 ENCOUNTER — Other Ambulatory Visit: Payer: Medicaid Other

## 2021-05-21 LAB — COMPREHENSIVE METABOLIC PANEL
ALT: 24 IU/L (ref 0–32)
AST: 21 IU/L (ref 0–40)
Albumin/Globulin Ratio: 1.4 (ref 1.2–2.2)
Albumin: 3.6 g/dL — ABNORMAL LOW (ref 3.8–4.8)
Alkaline Phosphatase: 77 IU/L (ref 44–121)
BUN/Creatinine Ratio: 23 (ref 9–23)
BUN: 25 mg/dL — ABNORMAL HIGH (ref 6–24)
Bilirubin Total: <0.2 mg/dL (ref 0.0–1.2)
CO2: 19 mmol/L — ABNORMAL LOW (ref 20–29)
Calcium: 9.6 mg/dL (ref 8.7–10.2)
Chloride: 107 mmol/L — ABNORMAL HIGH (ref 96–106)
Creatinine, Ser: 1.1 mg/dL — ABNORMAL HIGH (ref 0.57–1.00)
Globulin, Total: 2.5 g/dL (ref 1.5–4.5)
Glucose: 98 mg/dL (ref 70–99)
Potassium: 4.8 mmol/L (ref 3.5–5.2)
Sodium: 144 mmol/L (ref 134–144)
Total Protein: 6.1 g/dL (ref 6.0–8.5)
eGFR: 65 mL/min/{1.73_m2} (ref >59)

## 2021-05-21 LAB — CBC
Hematocrit: 29.5 % — ABNORMAL LOW (ref 34.0–46.6)
Hemoglobin: 10 g/dL — ABNORMAL LOW (ref 11.1–15.9)
MCH: 28.7 pg (ref 26.6–33.0)
MCHC: 33.9 g/dL (ref 31.5–35.7)
MCV: 85 fL (ref 79–97)
Platelets: 297 10*3/uL (ref 150–450)
RBC: 3.49 x10E6/uL — ABNORMAL LOW (ref 3.77–5.28)
RDW: 12.5 % (ref 11.7–15.4)
WBC: 5.2 10*3/uL (ref 3.4–10.8)

## 2021-05-21 NOTE — Telephone Encounter (Addendum)
-----   Message from Clarnce Flock, MD sent at 05/21/2021  8:40 AM EST ----- CBC unremarkable but CMP shows Cr 1.1, however given her medical hx I am unsure if this represents PreE or baseline renal disease. No prior checks for comparison unfortunately. Please have patient return later this week for in office BP check and repeat PreE labs.   Called pt with [place] # [phone] and left message that I am calling with results and an appt if she could please call the office.    Joyce Campbell  05/21/21

## 2021-05-22 NOTE — Telephone Encounter (Signed)
Called pt with [facility] interpreter ID [phone]. Patient given results and asked to return tomorrow for appt. Pt agreeable. Added to nurse schedule for 10:40 AM.

## 2021-05-23 ENCOUNTER — Ambulatory Visit (INDEPENDENT_AMBULATORY_CARE_PROVIDER_SITE_OTHER): Payer: Medicaid Other

## 2021-05-23 ENCOUNTER — Other Ambulatory Visit: Payer: Self-pay

## 2021-05-23 VITALS — BP 136/90 | HR 70 | Wt 204.7 lb

## 2021-05-23 DIAGNOSIS — R7989 Other specified abnormal findings of blood chemistry: Secondary | ICD-10-CM

## 2021-05-23 NOTE — Progress Notes (Signed)
Blood Pressure Check Visit  Joyce Campbell is here for blood pressure check following c-section on 05/13/21. Incision appears clean and dry. Loose dermabond removed from incision. BP today is 136/90. Patient denies any s/s of elevated BP. Nifedipine last taken before leaving home to come to appt approx 10 AM. Reviewed with Joyce Reese, MD who states patient may continue current medication dose. Verbal order for CMP and protein/creatinine ratio given due to elevated serum creatinine level on 05/20/21. Labs drawn. Explained we will contact patient with results and further plan of care.  Attempted to do [place] PP depression screening with patient. Patient becomes tearful after 1st question. Patient reports feelings of overwhelm due to baby being in NICU and having to take care of 3 children at home. Patient does not have anyone helping with the care of children. Patient's mother was hoping to come to the [place] for delivery, but visa was denied. Letter given to patient stating her mother is needed here for her recovery period. Report given to prenatal navigator Joyce Campbell who will contact [facility] navigator so they can follow up with patient through pediatrician office.  Encounter completed with [facility] interpreter Joyce Campbell ID [id number].  Joyce Bicker, RN 05/23/2021  10:56 AM

## 2021-05-23 NOTE — Progress Notes (Signed)
Chart reviewed for nurse visit. Agree with plan of care.   Venora Maples, MD 05/23/21 1:03 PM

## 2021-05-24 LAB — PROTEIN / CREATININE RATIO, URINE
Creatinine, Urine: 25.7 mg/dL
Protein, Ur: 6.5 mg/dL
Protein/Creat Ratio: 253 mg/g creat — ABNORMAL HIGH (ref 0–200)

## 2021-05-24 LAB — COMPREHENSIVE METABOLIC PANEL WITH GFR
ALT: 18 IU/L (ref 0–32)
AST: 23 IU/L (ref 0–40)
Albumin/Globulin Ratio: 1.7 (ref 1.2–2.2)
Albumin: 4.2 g/dL (ref 3.8–4.8)
Alkaline Phosphatase: 81 IU/L (ref 44–121)
BUN/Creatinine Ratio: 17 (ref 9–23)
BUN: 21 mg/dL (ref 6–24)
Bilirubin Total: 0.2 mg/dL (ref 0.0–1.2)
CO2: 23 mmol/L (ref 20–29)
Calcium: 9.6 mg/dL (ref 8.7–10.2)
Chloride: 105 mmol/L (ref 96–106)
Creatinine, Ser: 1.27 mg/dL — ABNORMAL HIGH (ref 0.57–1.00)
Globulin, Total: 2.5 g/dL (ref 1.5–4.5)
Glucose: 92 mg/dL (ref 70–99)
Potassium: 4.9 mmol/L (ref 3.5–5.2)
Sodium: 141 mmol/L (ref 134–144)
Total Protein: 6.7 g/dL (ref 6.0–8.5)
eGFR: 55 mL/min/1.73 — ABNORMAL LOW

## 2021-05-25 ENCOUNTER — Ambulatory Visit: Payer: Self-pay

## 2021-05-25 NOTE — Lactation Note (Addendum)
This note was copied from a baby's chart. Lactation Consultation Note  Patient Name: Joyce Campbell [id number] Date: 05/25/2021 Reason for consult: Follow-up assessment;Other (Comment);NICU baby;Early term 37-38.6wks;Breastfeeding assistance (Trisomy 21, LGA, AMA) Age:41 days  Used Multimedia programmer for [place], [place] Haggard # [phone] "Joyce Campbell".  Visited with mom of 41 67/69 weeks old (adjusted) NICU female, she's a P4 and requested assistance with latch. This is the first time mom is taking baby to breast, even though she has BF her other children.  LC took baby to the left breast in cross cradle hold, she was actively cueing but it took a few attempts for her to latch; probably due to oral restrictions/anatomy. Baby spent 7 minutes at the breast but only 3 were actively sucking (see LATCH score). Asked mom to call for assistance when needed, she plans to continue taking baby to breast.  Maternal Data  Mom's supply is BNL probably due to sleeping through the night  Feeding Mother's Current Feeding Choice: Breast Milk  LATCH Score Latch: Repeated attempts needed to sustain latch, nipple held in mouth throughout feeding, stimulation needed to elicit sucking reflex. (baby would take multiple breaks, but she was actively cueing prior feeding)  Audible Swallowing: None (NNS pattern was predominant)  Type of Nipple: Everted at rest and after stimulation  Comfort (Breast/Nipple): Soft / non-tender  Hold (Positioning): Assistance needed to correctly position infant at breast and maintain latch.  LATCH Score: 6  Lactation Tools Discussed/Used Tools: Pump;Flanges Flange Size: 27 Breast pump type: Double-Electric Breast Pump Pump Education: Setup, frequency, and cleaning;Milk Storage Reason for Pumping: ETI in NICU Pumping frequency: 6 times/24 hours (no pumping at night) Pumped volume: 60 mL (60-90 ml)  Interventions Interventions: Breast feeding basics reviewed;Assisted with  latch;Breast compression;Support pillows;Adjust position;DEBP;Education  Plan of care Encouraged mom to start pumping consistently every 3 hours, at least 8 pumping sessions/24 hours and to wake up at least one time at night to pump She'll continue taking baby to breast strictly on feeding cues   No other support person at this time. All questions and concerns answered, mom to call NICU LC PRN.  Discharge Pump: DEBP;Personal (WIC pump)  Consult Status Consult Status: Follow-up Date: 05/25/21 Follow-up type: In-patient   Philomena Buttermore Venetia Constable 05/25/2021, 5:40 PM

## 2021-05-26 ENCOUNTER — Telehealth: Payer: Self-pay

## 2021-05-26 NOTE — Telephone Encounter (Signed)
-----   Message from Venora Maples, MD sent at 05/26/2021  9:22 AM EST ----- Normal UPCR Cr remains elevated, at this point strongest suspicion is for CKD due to longstanding DM2 and HTN.  Please call patient and let her know her kidney function is lower than normal, likely due to other medical conditions, and we strongly recommend she connect with a PCP for ongoing management of this issue as well as possible Nephrology referral.

## 2021-05-28 ENCOUNTER — Ambulatory Visit: Payer: Medicaid Other

## 2021-06-01 ENCOUNTER — Ambulatory Visit: Payer: Self-pay

## 2021-06-01 NOTE — Lactation Note (Signed)
This note was copied from a baby's chart. Lactation Consultation Note  Patient Name: Joyce Campbell [id number] Date: 06/01/2021 Reason for consult: Follow-up assessment;NICU baby;Other (Comment);Early term 89-38.6wks;Maternal endocrine disorder (trisomy 31, AMA, LGA) Age:41 wk.o.  Used Multimedia programmer for [place], interpreter # [phone] "Joyce Campbell"  Visited with mom of 27 7/81 weeks old (adjusted) NICU female, she's a P4 and reports pumping is going OK although she's not pumping as much as she used to. She also voiced that she's been taking baby to breast for 10-15 minutes whenever she comes to visit; praised her for her efforts.  Explained to mom the importance of consistent pumping to protect her supply, she understands that after baby has her heart surgery, she'll need to exclusively pump during the recovery period and that strengthening her supply will be paramount to keep up with baby's demand. Baby is currently on breastmilk and Similac 24 calorie formula.   Maternal Data  Mom's supply is BNL probably due to decreased pumping  Feeding Mother's Current Feeding Choice: Breast Milk and Formula  Lactation Tools Discussed/Used Tools: Pump;Flanges Flange Size: 27 Breast pump type: Double-Electric Breast Pump Pump Education: Setup, frequency, and cleaning;Milk Storage Reason for Pumping: ETI in NICU Pumping frequency: 3-4 times/24 hours Pumped volume: 60 mL  Interventions  DEBP, education, Breastfeeding basics  Plan of care Encouraged mom to start pumping consistently every 3 hours, at least 8 pumping sessions/24 hours and to wake up at least one time at night to pump She'll continue taking baby to breast based on feeding cues   No other support person at this time. All questions and concerns answered, mom to call NICU LC PRN.  Discharge Pump: DEBP;Personal (WIC pump)  Consult Status Consult Status: Follow-up Date: 06/01/21 Follow-up type:  In-patient   Joyce Campbell Joyce Campbell 06/01/2021, 4:09 PM

## 2021-06-03 NOTE — Telephone Encounter (Signed)
[facility]t [facility] for [facility]21 at 0920. Will return for PP appt on 06/24/21.   [place] interpreter ID [phone]. Upcoming appts reviewed with patient.

## 2021-06-08 ENCOUNTER — Ambulatory Visit: Payer: Self-pay

## 2021-06-08 NOTE — Lactation Note (Signed)
This note was copied from a baby's chart. Lactation Consultation Note  Patient Name: Joyce Campbell [id number] Date: 06/08/2021 Reason for consult: Maternal endocrine disorder;Follow-up assessment;NICU baby;Other (Comment);Term;Breastfeeding assistance (AMA, trisomy 21) Age:41 wk.o.  Used Multimedia programmer for [place], interpreter # [phone] "Theron Arista".  Visited with mom of 3 44/66 weeks old (adjusted) NICU female, SLP Byrd Hesselbach asked this LC to check on mom and see how baby "Joyce Campbell" does at the breast. Mom reports that she hasn't been getting anything with pumping today, noticed that she slowed down even more on her pumping. Asked her what her goals were and she voiced that she'd like to breastfeed. Explained to mom that pumping will protect her supply and will assure we have some breastmilk available for baby "Joyce Campbell" when she goes to breast; she voiced understanding.  LC took baby to mother's left breast in cross cradle position, but baby had difficulty latching on, she was cueing but she has a tongue tie; she wasn't able to get a sufficient amount of areolar tissue due to mom doing the scissors hold (see LATCH score). LC has to stop this feeding attempt due to a tachycardia event, shortly after mom tried to put baby back again to the breast, explained to her again with [place] interpreter that we don't want to stress her out too much and that it's better to just have her feeding tube running.  Educated mom on the importance of not mixing different batches/pumping times in the same bottle, even if the amounts seem to be small, she voiced understanding as well.  Maternal Data  Mom's supply is dwindling and is BNL, she wasn't able to get any drops today  Feeding Mother's Current Feeding Choice: Breast Milk and Formula Nipple Type: Dr. Irving Burton Preemie (wide base)  LATCH Score Latch: Repeated attempts needed to sustain latch, nipple held in mouth throughout feeding, stimulation needed to  elicit sucking reflex. (only a few sucks, mom kept doing "scissors" hold and only exposing the tip of the nipple for baby, try to redirect her but even with the [place] interpreter she would not follow suggestions)  Audible Swallowing: None  Type of Nipple: Everted at rest and after stimulation (short shafed but tissue is very compressible)  Comfort (Breast/Nipple): Soft / non-tender  Hold (Positioning): Assistance needed to correctly position infant at breast and maintain latch.  LATCH Score: 6  Lactation Tools Discussed/Used Tools: Pump;Flanges Flange Size: 27 Breast pump type: Double-Electric Breast Pump Pump Education: Setup, frequency, and cleaning;Milk Storage Reason for Pumping: NICU infant Pumping frequency: 3 times/24 hours Pumped volume: 0 mL (mom voiced she didn't get any drops today, only 90 ml yesterday all together in a 24 hours period)  Interventions Interventions: Breast feeding basics reviewed;Assisted with latch;Breast compression;Adjust position;Support pillows;DEBP;Education  Plan of care Encouraged mom to start pumping consistently every 3 hours, at least 8 pumping sessions/24 hours and to wake up at least one time at night to pump She'll continue taking baby to a "full breast" based on feeding cues, baby might benefit from a NS # 20 for the next feeding attempt   No other support person at this time. All questions and concerns answered, mom to call NICU LC PRN.  Discharge Pump: DEBP;Personal (WIC pump)  Consult Status Consult Status: Follow-up Date: 06/08/21 Follow-up type: In-patient   Hadleigh Felber Venetia Constable 06/08/2021, 5:41 PM

## 2021-06-19 ENCOUNTER — Other Ambulatory Visit: Payer: Self-pay | Admitting: General Practice

## 2021-06-19 DIAGNOSIS — Z8632 Personal history of gestational diabetes: Secondary | ICD-10-CM

## 2021-06-20 ENCOUNTER — Ambulatory Visit: Payer: Self-pay

## 2021-06-20 NOTE — Lactation Note (Signed)
This note was copied from a baby's chart. ?Lactation Consultation Note ? ?Patient Name: Joyce Campbell ?Today's Date: 06/20/2021 ?Reason for consult: Follow-up assessment;NICU baby;Maternal endocrine disorder;Other (Comment);Term (AMA, trisomy 67, LGA) ?Age:41 wk.o. ? ?Used Multimedia programmer for [place], interpreter # [phone] "Joyce Campbell". ? ?Visited with mom of 21 65/105 weeks old (adjusted) NICU female, mom is taking baby home today. Reviewed discharge education, she's still pumping although volumes remain low. Mom would like to be F/U with LC OP since her plan is to try to take baby to breast, BF while in the NICU has been challenging as well, baby has a tongue tie and oral restrictions due to trisomy 21. ? ?Encouraged mom to continue pumping to protect her supply, and to put baby "Joyce Campbell" to breast on feeding cues, even if it's for some STS time. All questions and concerns answered, mom was given another LC brochure to contact LC services PRN.  ? ?Maternal Data ? Mom's supply is still BNL ? ?Feeding ?Mother's Current Feeding Choice: Breast Milk and Formula ?Nipple Type: Other (Avent level 5) ? ?Lactation Tools Discussed/Used ?Tools: Pump;Flanges ?Flange Size: 27 ?Breast pump type: Double-Electric Breast Pump ?Pump Education: Setup, frequency, and cleaning;Milk Storage ?Reason for Pumping: NICU infant ?Pumping frequency: 6 times/24 hours ?Pumped volume: 10 mL ? ?Interventions ?Interventions: LC Services brochure;Education ? ?Discharge ?Discharge Education: Outpatient recommendation;Outpatient Epic message sent ?Pump: DEBP;Personal (WIC pump) ? ?Consult Status ?Consult Status: Complete ?Date: 06/20/21 ?Follow-up type: Call as needed ? ? ?Joyce Campbell ?06/20/2021, 6:18 PM ? ? ? ?

## 2021-06-24 ENCOUNTER — Ambulatory Visit: Payer: Medicaid Other | Admitting: Advanced Practice Midwife

## 2021-06-24 ENCOUNTER — Other Ambulatory Visit: Payer: Medicaid Other

## 2021-07-01 ENCOUNTER — Ambulatory Visit: Payer: Medicaid Other | Admitting: Nurse Practitioner

## 2021-07-02 ENCOUNTER — Ambulatory Visit: Payer: Medicaid Other | Admitting: Nurse Practitioner

## 2021-08-04 ENCOUNTER — Ambulatory Visit (INDEPENDENT_AMBULATORY_CARE_PROVIDER_SITE_OTHER): Payer: Medicaid Other | Admitting: Obstetrics and Gynecology

## 2021-08-04 ENCOUNTER — Encounter: Payer: Self-pay | Admitting: Obstetrics and Gynecology

## 2021-08-04 VITALS — BP 117/81 | HR 80 | Wt 208.1 lb

## 2021-08-04 DIAGNOSIS — Z789 Other specified health status: Secondary | ICD-10-CM

## 2021-08-04 DIAGNOSIS — Z01419 Encounter for gynecological examination (general) (routine) without abnormal findings: Secondary | ICD-10-CM | POA: Diagnosis not present

## 2021-08-04 DIAGNOSIS — O24419 Gestational diabetes mellitus in pregnancy, unspecified control: Secondary | ICD-10-CM

## 2021-08-04 NOTE — Patient Instructions (Signed)

## 2021-08-04 NOTE — Progress Notes (Signed)
Joyce Campbell is a 41 y.o. [phone] female here for a routine annual gynecologic exam.  Current complaints: None.   Denies abnormal vaginal bleeding, discharge, pelvic pain, problems with intercourse or other gynecologic concerns.  ?  ?Gynecologic History ?Patient's last menstrual period was 06/18/2020 (approximate). ?Contraception: none ?Last Pap: 9/22. Results were: normal ?Last mammogram: Declined ? ?Obstetric History ?OB History  ?Gravida Para Term Preterm AB Living  ?4 4 3 1   4   ?SAB IAB Ectopic Multiple Live Births  ?      0 4  ?  ?# Outcome Date GA Lbr Len/2nd Weight Sex Delivery Anes PTL Lv  ?4 Preterm 05/13/21 [redacted]w[redacted]d  9 lb 4.2 oz (4.2 kg) F CS-LTranv EPI  LIV  ?3 Term 12/10/14 [redacted]w[redacted]d  12 lb (5.443 kg)  Vag-Spont     ?2 Term 08/31/09 [redacted]w[redacted]d  9 lb (4.082 kg)  Vag-Spont     ?1 Term 02/20/07 [redacted]w[redacted]d  8 lb (3.629 kg)  Vag-Spont     ? ? ?Past Medical History:  ?Diagnosis Date  ? Diabetes mellitus without complication ([place])   ? Gestional  ? Fetal VSD (ventricular septal defect), perimembranous 03/21/2021  ? [ ]  f/u 05/01/21 rpt 03/03/21: Fetal Echo VSD, mild mitral regurgitation; should be fine for vag delivery and delivery at [facility] per last report but f/u 1/12 report but f/u repeat Fetal Echo 6-8 wks  ? Fetus with chance for Trisomy 21 03/17/2021  ? Quad screen Down Syndrome Risk 1 in 41 Patient declined panorama Absent nasal bone, ventriculomegaly and abnormal fetal echo seen  ? Gestational diabetes 05/12/2021  ? Pregnancy complicated by fetal cerebral ventriculomegaly 04/25/2021  ? 04/16/21: Right 22mm and left 54mm; BPD 89%, HC 36% -also w/ dangling choroid, absent nasal bone -declined GC and cffdna -quad screen with 1:51 trisomy 21 risk  ? ? ?Past Surgical History:  ?Procedure Laterality Date  ? CESAREAN SECTION  05/13/2021  ? CESAREAN SECTION N/A 05/13/2021  ? Procedure: CESAREAN SECTION;  Surgeon: Truett Mainland, DO;  Location: [facility]S;  Service: Obstetrics;  Laterality: N/A;  ? ? ?Current Outpatient  Medications on File Prior to Visit  ?Medication Sig Dispense Refill  ? prenatal vitamin w/FE, FA (PRENATAL 1 + 1) 27-1 MG TABS tablet Take 1 tablet by mouth daily at 12 noon. 30 tablet 11  ? ibuprofen (ADVIL) 600 MG tablet Take 1 tablet (600 mg total) by mouth every 6 (six) hours. (Patient not taking: Reported on 08/04/2021) 30 tablet 0  ? NIFEdipine (PROCARDIA-XL/NIFEDICAL-XL) 30 MG 24 hr tablet Take 1 tablet (30 mg total) by mouth daily. (Patient not taking: Reported on 08/04/2021) 60 tablet 0  ? oxyCODONE (OXY IR/ROXICODONE) 5 MG immediate release tablet Take 1 tablet (5 mg total) by mouth every 4 (four) hours as needed for moderate pain. (Patient not taking: Reported on 08/04/2021) 30 tablet 0  ? ?No current facility-administered medications on file prior to visit.  ? ? ?No Known Allergies ? ?Social History  ? ?Socioeconomic History  ? Marital status: Single  ?  Spouse name: Not on file  ? Number of children: Not on file  ? Years of education: Not on file  ? Highest education level: Not on file  ?Occupational History  ? Not on file  ?Tobacco Use  ? Smoking status: Never  ? Smokeless tobacco: Never  ?Vaping Use  ? Vaping Use: Never used  ?Substance and Sexual Activity  ? Alcohol use: Never  ? Drug use: Never  ? Sexual  activity: Yes  ?  Birth control/protection: None  ?Other Topics Concern  ? Not on file  ?Social History Narrative  ? Not on file  ? ?Social Determinants of Health  ? ?Financial Resource Strain: Not on file  ?Food Insecurity: No Food Insecurity  ? Worried About Charity fundraiser in the Last Year: Never true  ? Ran Out of Food in the Last Year: Never true  ?Transportation Needs: No Transportation Needs  ? Lack of Transportation (Medical): No  ? Lack of Transportation (Non-Medical): No  ?Physical Activity: Not on file  ?Stress: Not on file  ?Social Connections: Not on file  ?Intimate Partner Violence: Not on file  ? ? ?Family History  ?Problem Relation Age of Onset  ? Hypertension Mother   ? ? ?The  following portions of the patient's history were reviewed and updated as appropriate: allergies, current medications, past family history, past medical history, past social history, past surgical history and problem list. ? ?Review of Systems ?Pertinent items noted in HPI and remainder of comprehensive ROS otherwise negative. ?  ?Objective:  ?BP 117/81   Pulse 80   Wt 208 lb 1.6 oz (94.4 kg)   LMP 06/18/2020 (Approximate)   Breastfeeding No   BMI 37.34 kg/m?  ?CONSTITUTIONAL: Well-developed, well-nourished female in no acute distress.  ?HENT:  Normocephalic, atraumatic, External right and left ear normal. Oropharynx is clear and moist ?EYES: Conjunctivae and EOM are normal. Pupils are equal, round, and reactive to light. No scleral icterus.  ?NECK: Normal range of motion, supple, no masses.  Normal thyroid.  ?SKIN: Skin is warm and dry. No rash noted. Not diaphoretic. No erythema. No pallor. ?[place]: Alert and oriented to person, place, and time. Normal reflexes, muscle tone coordination. No cranial nerve deficit noted. ?PSYCHIATRIC: Normal mood and affect. Normal behavior. Normal judgment and thought content. ?CARDIOVASCULAR: Normal heart rate noted, regular rhythm ?RESPIRATORY: Clear to auscultation bilaterally. Effort and breath sounds normal, no problems with respiration noted. ?BREASTS: Deferred ?ABDOMEN: Soft, normal bowel sounds, no distention noted.  No tenderness, rebound or guarding.  ?PELVIC: Not indicated ?MUSCULOSKELETAL: Normal range of motion. No tenderness.  No cyanosis, clubbing, or edema.  2+ distal pulses. ? ? ?Assessment:  ?Annual gynecologic examination  ?  ?Plan:  ?Will follow up results of pap smear and manage accordingly. ?Mammogram declined ?Contraception declined ?Routine preventative health maintenance measures emphasized. ?Please refer to After Visit Summary for other counseling recommendations.  ? ? ?Chancy Milroy, MD, FACOG ?Attending [place] for  [employer], [place]  ?

## 2021-08-13 ENCOUNTER — Other Ambulatory Visit: Payer: Medicaid Other

## 2021-08-27 ENCOUNTER — Other Ambulatory Visit: Payer: Medicaid Other

## 2021-08-27 DIAGNOSIS — Z8632 Personal history of gestational diabetes: Secondary | ICD-10-CM

## 2021-08-28 LAB — GLUCOSE TOLERANCE, 2 HOURS
Glucose, 2 hour: 104 mg/dL (ref 70–139)
Glucose, GTT - Fasting: 92 mg/dL (ref 70–99)

## 2021-09-16 ENCOUNTER — Ambulatory Visit (HOSPITAL_COMMUNITY): Payer: Medicaid Other

## 2021-09-16 ENCOUNTER — Encounter (HOSPITAL_COMMUNITY): Payer: Medicaid Other

## 2023-05-03 IMAGING — US US MFM FETAL BPP W/ NON-STRESS
1 series · 13 of 28 positions shown · non-contrast
Comparison: none

[Series 1: us mfm fetal bpp w/ non-stress · 53 acquisitions, 13 frames shown]
[im 2/53]
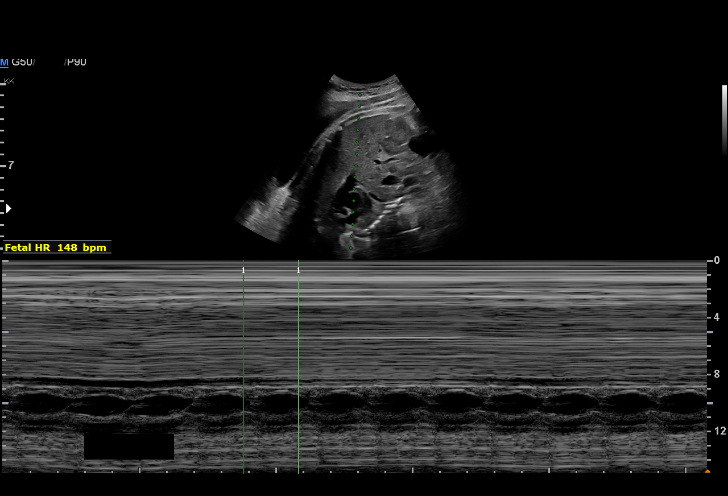
[im 6/53]
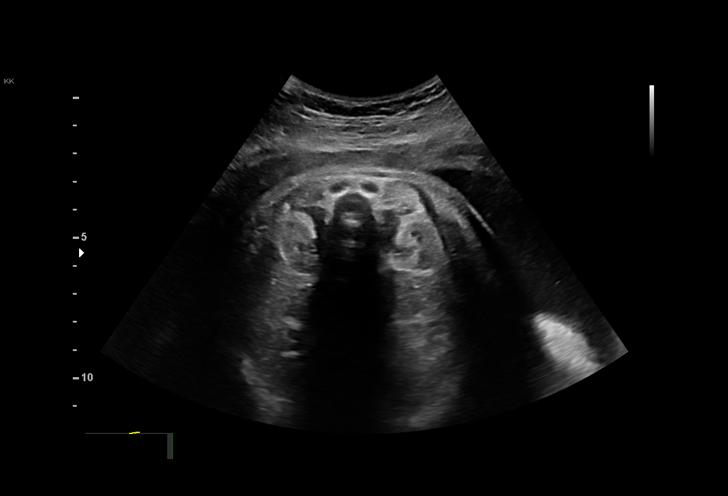
[im 10/53]
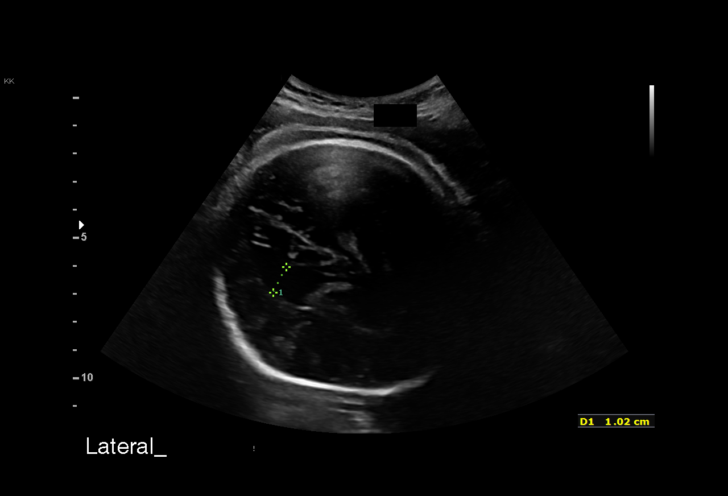
[im 14/53]
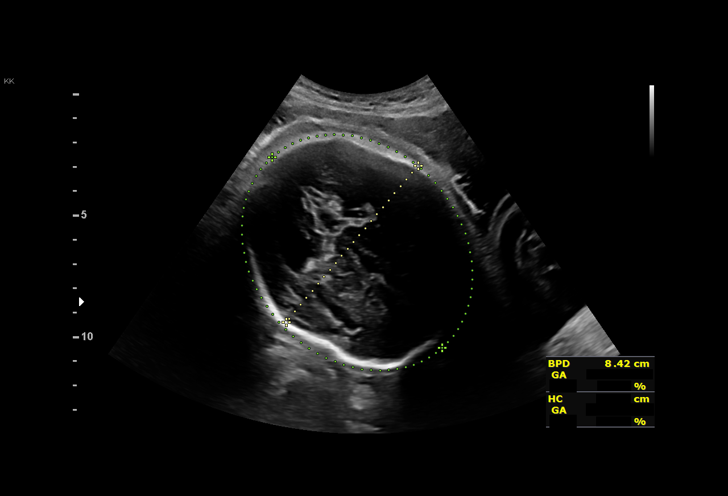
[im 18/53]
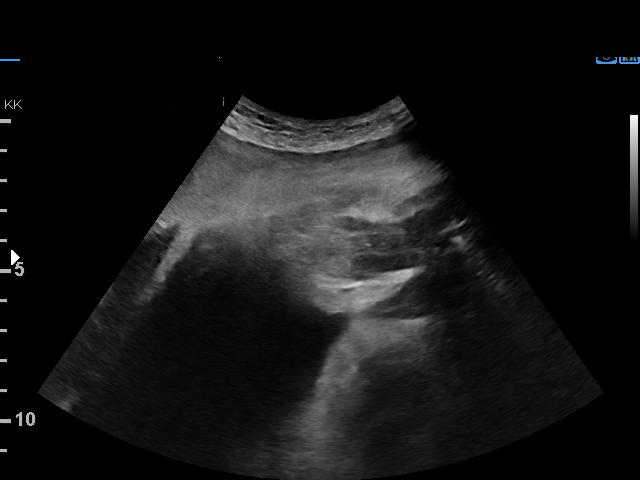
[im 22/53]
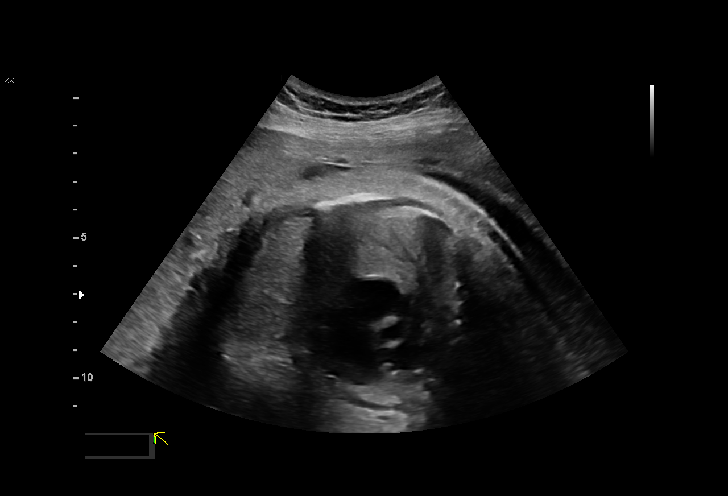
[im 27/53]
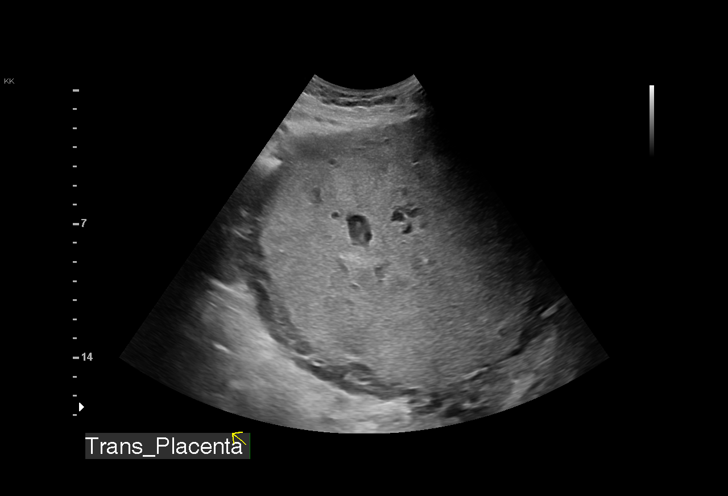
[im 31/53]
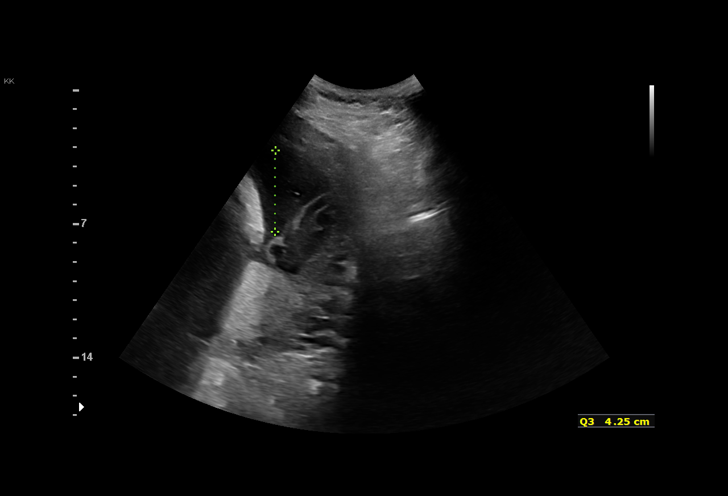
[im 35/53]
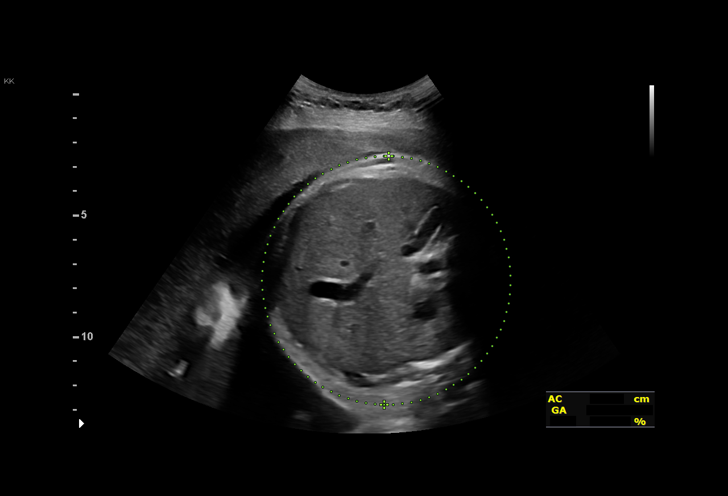
[im 39/53]
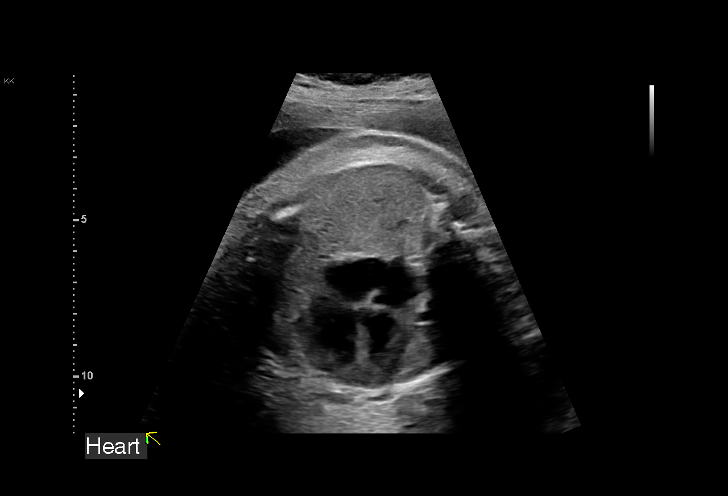
[im 43/53]
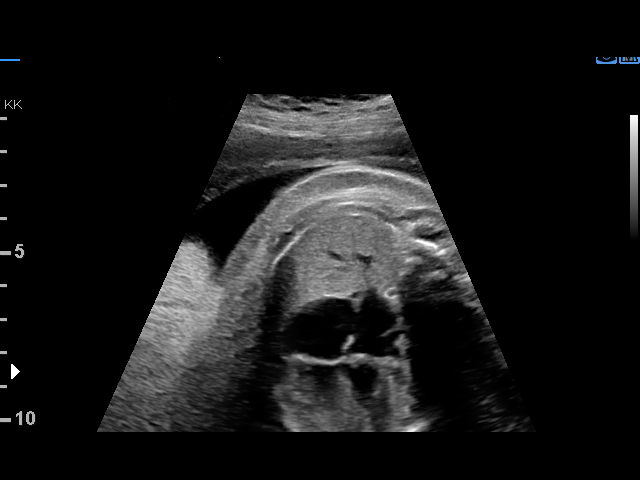
[im 47/53]
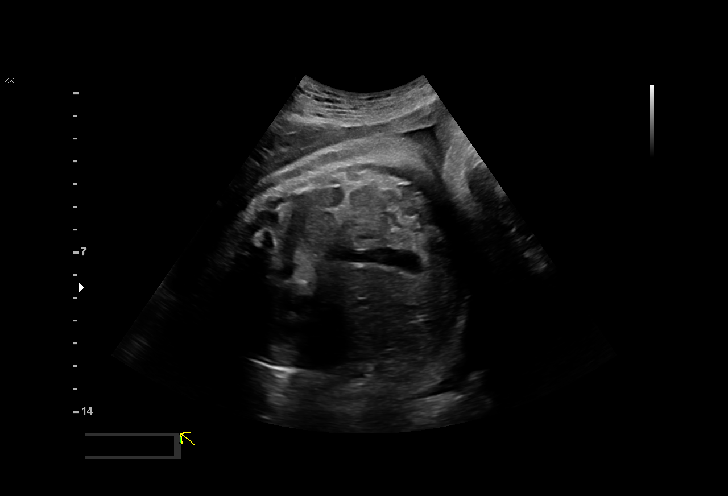
[im 51/53]
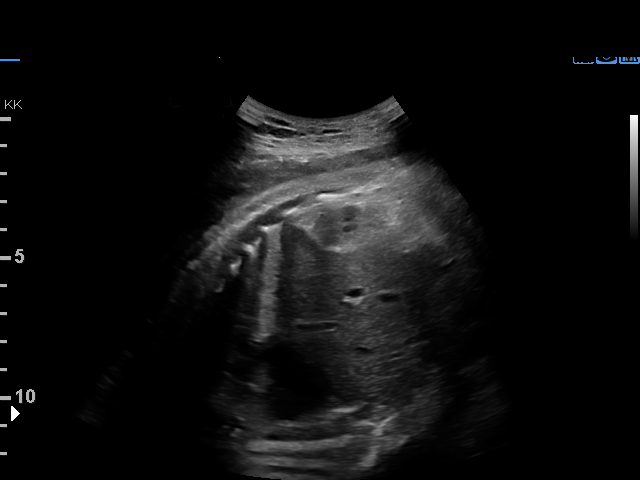

[13 of 28 positions shown; findings below may reference images not displayed]

[REDACTED]-
                                                            Faculty Physician
                   [facility]                                  [HOSPITAL] at

    W/NONSTRESS

Indications

 Advanced maternal age multigravida 35+,
 second trimester (AGE 40)
 Abnormal biochemical screen (quad) for
 Trisomy 21 ([DATE])
 32 weeks gestation of pregnancy
 Fetal cardiac anomaly affecting pregnancy,
 antepartum (VSD)
Fetal Evaluation

 Num Of Fetuses:         1
 Fetal Heart Rate(bpm):  148
 Cardiac Activity:       Observed
 Presentation:           Cephalic
 Placenta:               Posterior Fundal
 P. Cord Insertion:      Previously Visualized

 Amniotic Fluid
 AFI FV:      Within normal limits

 AFI Sum(cm)     %Tile       Largest Pocket(cm)
 21.6            83

 RUQ(cm)       RLQ(cm)       LUQ(cm)        LLQ(cm)

Biophysical Evaluation

 Amniotic F.V:   Pocket => 2 cm             F. Tone:        Observed
 F. Movement:    Observed                   N.S.T:          Reactive
 F. Breathing:   Not Observed               Score:          [DATE]
Biometry

 BPD:      84.8  mm     G. Age:  34w 1d         89  %    CI:        80.03   %    70 - 86
                                                         FL/HC:      20.6   %    19.1 -
 HC:      299.5  mm     G. Age:  33w 1d         36  %    HC/AC:      0.90        0.96 -
 AC:       332   mm     G. Age:  37w 1d       > 99  %    FL/BPD:     72.9   %    71 - 87
 FL:       61.8  mm     G. Age:  32w 0d         31  %    FL/AC:      18.6   %    20 - 24

 Est. FW:    8833  gm    5 lb 11 oz      99  %
OB History

 Blood Type:   O+
 Gravidity:    4         Term:   3
 Living:       3
Gestational Age

 LMP:           32w 2d        Date:  09/02/20                 EDD:   06/09/21
 U/S Today:     34w 1d                                        EDD:   05/27/21
 Best:          32w 2d     Det. By:  LMP  (09/02/20)          EDD:   06/09/21
Anatomy

 Cranium:               Appears normal         LVOT:                   Previously seen
 Cavum:                 Appears normal         Aortic Arch:            Previously seen
 Ventricles:            Bilat.ventriculomeg    Ductal Arch:            Previously seen
                        Scherer, Lalito 11/ L 13
 Choroid Plexus:        Dangling Choroid       Diaphragm:              Previously seen
 Cerebellum:            Previously seen        Stomach:                Appears normal, left
                                                                       sided
 Posterior Fossa:       Previously seen        Abdomen:                Previously seen
 Nuchal Fold:           Not applicable (>20    Abdominal Wall:         Previously seen
                        wks GA)
 Face:                  Absent nasal bone      Cord Vessels:           Previously seen
 Lips:                  Previously seen        Kidneys:                Appear normal
 Palate:                Not well visualized    Bladder:                Appears normal
 Thoracic:              Previously seen        Spine:                  Limited views
                                                                       previously seen
 Heart:                 Abnormal, see          Upper Extremities:      Previously seen
                        comments
 RVOT:                  Previously seen        Lower Extremities:      Previously seen

 Other:  Open hands, VC, 3VV and 3VTV previously visualized. Technically
         difficult due to fetal position.
Cervix Uterus Adnexa

 Cervix
 Not visualized (advanced GA >58wks)
Comments

 This patient was seen for a follow up exam due to a fetus with
 suspected trisomy 21.  She denies any problems since her
 last exam.
 She was informed that the fetal growth measures large for
 her gestational [AGE]th percentile).  There was normal
 amniotic fluid noted.  She will be screened for gestational
 diabetes following today's ultrasound exam.
 Mild bilateral ventriculomegaly measuring 1.1 to 1.3 cm
 dilated continues to be noted on today's exam.  This is a
 stable finding as compared to her prior exam.
 A BPP performed today was [DATE].  She received a -2 for
 fetal breathing movements that did not meet criteria.  She
 subsequently had a reactive NST making her total
 biophysical profile score [DATE].
 Another BPP was scheduled in 1 week.
 She was advised that her baby will need to be tested after
 birth to determine if the baby has Down syndrome.
 All conversations were held with the patient today with the
 help of a [place] interpreter.
